# Patient Record
Sex: Male | Born: 1935 | Race: White | Hispanic: No | Smoking: Former smoker
Health system: Southern US, Community
[De-identification: ages and names within clinical notes are randomized; demographics above are authoritative.]

## PROBLEM LIST (undated history)

## (undated) DIAGNOSIS — H353110 Nonexudative age-related macular degeneration, right eye, stage unspecified: Secondary | ICD-10-CM

## (undated) DIAGNOSIS — I493 Ventricular premature depolarization: Secondary | ICD-10-CM

## (undated) DIAGNOSIS — K449 Diaphragmatic hernia without obstruction or gangrene: Secondary | ICD-10-CM

## (undated) DIAGNOSIS — J449 Chronic obstructive pulmonary disease, unspecified: Secondary | ICD-10-CM

## (undated) DIAGNOSIS — R0609 Other forms of dyspnea: Secondary | ICD-10-CM

## (undated) DIAGNOSIS — H3530 Unspecified macular degeneration: Secondary | ICD-10-CM

## (undated) DIAGNOSIS — K5190 Ulcerative colitis, unspecified, without complications: Secondary | ICD-10-CM

## (undated) DIAGNOSIS — H353220 Exudative age-related macular degeneration, left eye, stage unspecified: Secondary | ICD-10-CM

## (undated) DIAGNOSIS — H9190 Unspecified hearing loss, unspecified ear: Secondary | ICD-10-CM

## (undated) DIAGNOSIS — J302 Other seasonal allergic rhinitis: Secondary | ICD-10-CM

## (undated) DIAGNOSIS — R351 Nocturia: Secondary | ICD-10-CM

## (undated) DIAGNOSIS — H5462 Unqualified visual loss, left eye, normal vision right eye: Secondary | ICD-10-CM

## (undated) DIAGNOSIS — G4730 Sleep apnea, unspecified: Secondary | ICD-10-CM

## (undated) DIAGNOSIS — K219 Gastro-esophageal reflux disease without esophagitis: Secondary | ICD-10-CM

## (undated) DIAGNOSIS — T7840XA Allergy, unspecified, initial encounter: Secondary | ICD-10-CM

## (undated) DIAGNOSIS — E785 Hyperlipidemia, unspecified: Secondary | ICD-10-CM

## (undated) DIAGNOSIS — R7303 Prediabetes: Secondary | ICD-10-CM

## (undated) HISTORY — PX: CHOLECYSTECTOMY: SHX55

## (undated) HISTORY — DX: Allergy, unspecified, initial encounter: T78.40XA

## (undated) HISTORY — DX: Sleep apnea, unspecified: G47.30

## (undated) HISTORY — PX: TONSILLECTOMY: SUR1361

## (undated) HISTORY — PX: APPENDECTOMY: SHX54

---
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
## 1983-04-23 HISTORY — PX: CHOLECYSTECTOMY OPEN: SUR202

## 1998-06-08 ENCOUNTER — Ambulatory Visit (HOSPITAL_COMMUNITY): Admission: RE | Admit: 1998-06-08 | Discharge: 1998-06-08 | Payer: Self-pay | Admitting: Gastroenterology

## 2004-03-06 ENCOUNTER — Ambulatory Visit: Payer: Self-pay | Admitting: Internal Medicine

## 2005-07-02 ENCOUNTER — Ambulatory Visit: Payer: Self-pay | Admitting: Internal Medicine

## 2005-08-19 ENCOUNTER — Ambulatory Visit: Payer: Self-pay | Admitting: Internal Medicine

## 2005-08-22 ENCOUNTER — Ambulatory Visit: Payer: Self-pay

## 2005-09-30 ENCOUNTER — Ambulatory Visit: Payer: Self-pay | Admitting: Internal Medicine

## 2005-10-07 ENCOUNTER — Ambulatory Visit: Payer: Self-pay | Admitting: Internal Medicine

## 2006-03-11 ENCOUNTER — Ambulatory Visit: Payer: Self-pay | Admitting: Internal Medicine

## 2006-12-16 DIAGNOSIS — E785 Hyperlipidemia, unspecified: Secondary | ICD-10-CM | POA: Insufficient documentation

## 2007-02-02 ENCOUNTER — Ambulatory Visit: Payer: Self-pay | Admitting: Internal Medicine

## 2007-02-02 DIAGNOSIS — S61209A Unspecified open wound of unspecified finger without damage to nail, initial encounter: Secondary | ICD-10-CM | POA: Insufficient documentation

## 2007-07-15 ENCOUNTER — Ambulatory Visit: Payer: Self-pay | Admitting: Internal Medicine

## 2007-07-15 DIAGNOSIS — J069 Acute upper respiratory infection, unspecified: Secondary | ICD-10-CM | POA: Insufficient documentation

## 2008-04-22 DIAGNOSIS — Z8719 Personal history of other diseases of the digestive system: Secondary | ICD-10-CM

## 2008-04-22 HISTORY — DX: Personal history of other diseases of the digestive system: Z87.19

## 2013-04-22 HISTORY — PX: COLONOSCOPY: SHX5424

## 2015-04-23 DIAGNOSIS — G4733 Obstructive sleep apnea (adult) (pediatric): Secondary | ICD-10-CM

## 2015-04-23 HISTORY — DX: Obstructive sleep apnea (adult) (pediatric): G47.33

## 2017-04-22 HISTORY — PX: CATARACT EXTRACTION W/ INTRAOCULAR LENS IMPLANT: SHX1309

## 2020-04-22 DIAGNOSIS — Z8781 Personal history of (healed) traumatic fracture: Secondary | ICD-10-CM

## 2020-04-22 HISTORY — DX: Personal history of (healed) traumatic fracture: Z87.81

## 2020-10-09 DIAGNOSIS — H353124 Nonexudative age-related macular degeneration, left eye, advanced atrophic with subfoveal involvement: Secondary | ICD-10-CM | POA: Diagnosis not present

## 2020-10-09 DIAGNOSIS — H353211 Exudative age-related macular degeneration, right eye, with active choroidal neovascularization: Secondary | ICD-10-CM | POA: Diagnosis not present

## 2020-11-01 DIAGNOSIS — J309 Allergic rhinitis, unspecified: Secondary | ICD-10-CM | POA: Diagnosis not present

## 2020-11-01 DIAGNOSIS — J40 Bronchitis, not specified as acute or chronic: Secondary | ICD-10-CM | POA: Diagnosis not present

## 2020-11-01 DIAGNOSIS — G4733 Obstructive sleep apnea (adult) (pediatric): Secondary | ICD-10-CM | POA: Diagnosis not present

## 2020-11-10 DIAGNOSIS — S3991XA Unspecified injury of abdomen, initial encounter: Secondary | ICD-10-CM | POA: Diagnosis not present

## 2020-11-10 DIAGNOSIS — Z7951 Long term (current) use of inhaled steroids: Secondary | ICD-10-CM | POA: Diagnosis not present

## 2020-11-10 DIAGNOSIS — I2510 Atherosclerotic heart disease of native coronary artery without angina pectoris: Secondary | ICD-10-CM | POA: Diagnosis not present

## 2020-11-10 DIAGNOSIS — Z87891 Personal history of nicotine dependence: Secondary | ICD-10-CM | POA: Diagnosis not present

## 2020-11-10 DIAGNOSIS — K760 Fatty (change of) liver, not elsewhere classified: Secondary | ICD-10-CM | POA: Diagnosis not present

## 2020-11-10 DIAGNOSIS — J449 Chronic obstructive pulmonary disease, unspecified: Secondary | ICD-10-CM | POA: Diagnosis not present

## 2020-11-10 DIAGNOSIS — S299XXA Unspecified injury of thorax, initial encounter: Secondary | ICD-10-CM | POA: Diagnosis not present

## 2020-11-10 DIAGNOSIS — R0789 Other chest pain: Secondary | ICD-10-CM | POA: Diagnosis not present

## 2020-11-10 DIAGNOSIS — Z9049 Acquired absence of other specified parts of digestive tract: Secondary | ICD-10-CM | POA: Diagnosis not present

## 2020-11-10 DIAGNOSIS — S301XXA Contusion of abdominal wall, initial encounter: Secondary | ICD-10-CM | POA: Diagnosis not present

## 2020-11-10 DIAGNOSIS — Z888 Allergy status to other drugs, medicaments and biological substances status: Secondary | ICD-10-CM | POA: Diagnosis not present

## 2020-11-10 DIAGNOSIS — S20211A Contusion of right front wall of thorax, initial encounter: Secondary | ICD-10-CM | POA: Diagnosis not present

## 2020-11-10 DIAGNOSIS — Z882 Allergy status to sulfonamides status: Secondary | ICD-10-CM | POA: Diagnosis not present

## 2020-11-10 DIAGNOSIS — R0781 Pleurodynia: Secondary | ICD-10-CM | POA: Diagnosis not present

## 2020-11-10 DIAGNOSIS — Z79899 Other long term (current) drug therapy: Secondary | ICD-10-CM | POA: Diagnosis not present

## 2020-11-10 DIAGNOSIS — Z7952 Long term (current) use of systemic steroids: Secondary | ICD-10-CM | POA: Diagnosis not present

## 2020-11-10 DIAGNOSIS — S3993XA Unspecified injury of pelvis, initial encounter: Secondary | ICD-10-CM | POA: Diagnosis not present

## 2020-11-10 DIAGNOSIS — W050XXA Fall from non-moving wheelchair, initial encounter: Secondary | ICD-10-CM | POA: Diagnosis not present

## 2020-11-27 DIAGNOSIS — C44321 Squamous cell carcinoma of skin of nose: Secondary | ICD-10-CM | POA: Diagnosis not present

## 2020-11-27 DIAGNOSIS — L570 Actinic keratosis: Secondary | ICD-10-CM | POA: Diagnosis not present

## 2020-11-27 DIAGNOSIS — L578 Other skin changes due to chronic exposure to nonionizing radiation: Secondary | ICD-10-CM | POA: Diagnosis not present

## 2020-11-28 DIAGNOSIS — G4733 Obstructive sleep apnea (adult) (pediatric): Secondary | ICD-10-CM | POA: Diagnosis not present

## 2020-12-04 DIAGNOSIS — Z48817 Encounter for surgical aftercare following surgery on the skin and subcutaneous tissue: Secondary | ICD-10-CM | POA: Diagnosis not present

## 2020-12-06 DIAGNOSIS — K219 Gastro-esophageal reflux disease without esophagitis: Secondary | ICD-10-CM | POA: Diagnosis not present

## 2020-12-06 DIAGNOSIS — F4322 Adjustment disorder with anxiety: Secondary | ICD-10-CM | POA: Diagnosis not present

## 2020-12-06 DIAGNOSIS — H6122 Impacted cerumen, left ear: Secondary | ICD-10-CM | POA: Diagnosis not present

## 2020-12-06 DIAGNOSIS — J310 Chronic rhinitis: Secondary | ICD-10-CM | POA: Diagnosis not present

## 2020-12-06 DIAGNOSIS — R0982 Postnasal drip: Secondary | ICD-10-CM | POA: Diagnosis not present

## 2020-12-06 DIAGNOSIS — J42 Unspecified chronic bronchitis: Secondary | ICD-10-CM | POA: Diagnosis not present

## 2020-12-06 DIAGNOSIS — G4733 Obstructive sleep apnea (adult) (pediatric): Secondary | ICD-10-CM | POA: Diagnosis not present

## 2020-12-06 DIAGNOSIS — E669 Obesity, unspecified: Secondary | ICD-10-CM | POA: Diagnosis not present

## 2020-12-11 DIAGNOSIS — H353211 Exudative age-related macular degeneration, right eye, with active choroidal neovascularization: Secondary | ICD-10-CM | POA: Diagnosis not present

## 2020-12-11 DIAGNOSIS — H353124 Nonexudative age-related macular degeneration, left eye, advanced atrophic with subfoveal involvement: Secondary | ICD-10-CM | POA: Diagnosis not present

## 2020-12-12 DIAGNOSIS — J441 Chronic obstructive pulmonary disease with (acute) exacerbation: Secondary | ICD-10-CM | POA: Diagnosis not present

## 2020-12-12 DIAGNOSIS — Z9989 Dependence on other enabling machines and devices: Secondary | ICD-10-CM | POA: Diagnosis not present

## 2020-12-12 DIAGNOSIS — Z809 Family history of malignant neoplasm, unspecified: Secondary | ICD-10-CM | POA: Diagnosis not present

## 2020-12-12 DIAGNOSIS — X58XXXS Exposure to other specified factors, sequela: Secondary | ICD-10-CM | POA: Diagnosis not present

## 2020-12-12 DIAGNOSIS — R0600 Dyspnea, unspecified: Secondary | ICD-10-CM | POA: Diagnosis not present

## 2020-12-12 DIAGNOSIS — Z9049 Acquired absence of other specified parts of digestive tract: Secondary | ICD-10-CM | POA: Diagnosis not present

## 2020-12-12 DIAGNOSIS — J440 Chronic obstructive pulmonary disease with acute lower respiratory infection: Secondary | ICD-10-CM | POA: Diagnosis not present

## 2020-12-12 DIAGNOSIS — K219 Gastro-esophageal reflux disease without esophagitis: Secondary | ICD-10-CM | POA: Diagnosis not present

## 2020-12-12 DIAGNOSIS — Z66 Do not resuscitate: Secondary | ICD-10-CM | POA: Diagnosis not present

## 2020-12-12 DIAGNOSIS — J209 Acute bronchitis, unspecified: Secondary | ICD-10-CM | POA: Diagnosis not present

## 2020-12-12 DIAGNOSIS — S42301A Unspecified fracture of shaft of humerus, right arm, initial encounter for closed fracture: Secondary | ICD-10-CM | POA: Diagnosis not present

## 2020-12-12 DIAGNOSIS — S42301S Unspecified fracture of shaft of humerus, right arm, sequela: Secondary | ICD-10-CM | POA: Diagnosis not present

## 2020-12-12 DIAGNOSIS — Z7982 Long term (current) use of aspirin: Secondary | ICD-10-CM | POA: Diagnosis not present

## 2020-12-12 DIAGNOSIS — Z20822 Contact with and (suspected) exposure to covid-19: Secondary | ICD-10-CM | POA: Diagnosis not present

## 2020-12-12 DIAGNOSIS — D721 Eosinophilia, unspecified: Secondary | ICD-10-CM | POA: Diagnosis not present

## 2020-12-12 DIAGNOSIS — R0602 Shortness of breath: Secondary | ICD-10-CM | POA: Diagnosis not present

## 2020-12-12 DIAGNOSIS — Z888 Allergy status to other drugs, medicaments and biological substances status: Secondary | ICD-10-CM | POA: Diagnosis not present

## 2020-12-12 DIAGNOSIS — Z87891 Personal history of nicotine dependence: Secondary | ICD-10-CM | POA: Diagnosis not present

## 2020-12-12 DIAGNOSIS — R0902 Hypoxemia: Secondary | ICD-10-CM | POA: Diagnosis not present

## 2020-12-12 DIAGNOSIS — Z7951 Long term (current) use of inhaled steroids: Secondary | ICD-10-CM | POA: Diagnosis not present

## 2020-12-12 DIAGNOSIS — Z882 Allergy status to sulfonamides status: Secondary | ICD-10-CM | POA: Diagnosis not present

## 2020-12-12 DIAGNOSIS — Z79899 Other long term (current) drug therapy: Secondary | ICD-10-CM | POA: Diagnosis not present

## 2020-12-12 DIAGNOSIS — G4733 Obstructive sleep apnea (adult) (pediatric): Secondary | ICD-10-CM | POA: Diagnosis not present

## 2020-12-29 DIAGNOSIS — F5102 Adjustment insomnia: Secondary | ICD-10-CM | POA: Diagnosis not present

## 2020-12-29 DIAGNOSIS — G4733 Obstructive sleep apnea (adult) (pediatric): Secondary | ICD-10-CM | POA: Diagnosis not present

## 2020-12-29 DIAGNOSIS — J42 Unspecified chronic bronchitis: Secondary | ICD-10-CM | POA: Diagnosis not present

## 2020-12-29 DIAGNOSIS — R0982 Postnasal drip: Secondary | ICD-10-CM | POA: Diagnosis not present

## 2021-01-01 DIAGNOSIS — Z48817 Encounter for surgical aftercare following surgery on the skin and subcutaneous tissue: Secondary | ICD-10-CM | POA: Diagnosis not present

## 2021-01-08 DIAGNOSIS — Z0000 Encounter for general adult medical examination without abnormal findings: Secondary | ICD-10-CM | POA: Diagnosis not present

## 2021-01-14 DIAGNOSIS — J440 Chronic obstructive pulmonary disease with acute lower respiratory infection: Secondary | ICD-10-CM | POA: Diagnosis not present

## 2021-01-14 DIAGNOSIS — J209 Acute bronchitis, unspecified: Secondary | ICD-10-CM | POA: Diagnosis not present

## 2021-01-14 DIAGNOSIS — J441 Chronic obstructive pulmonary disease with (acute) exacerbation: Secondary | ICD-10-CM | POA: Diagnosis not present

## 2021-01-14 DIAGNOSIS — R058 Other specified cough: Secondary | ICD-10-CM | POA: Diagnosis not present

## 2021-02-13 DIAGNOSIS — R058 Other specified cough: Secondary | ICD-10-CM | POA: Diagnosis not present

## 2021-02-13 DIAGNOSIS — J441 Chronic obstructive pulmonary disease with (acute) exacerbation: Secondary | ICD-10-CM | POA: Diagnosis not present

## 2021-02-13 DIAGNOSIS — J440 Chronic obstructive pulmonary disease with acute lower respiratory infection: Secondary | ICD-10-CM | POA: Diagnosis not present

## 2021-02-13 DIAGNOSIS — J209 Acute bronchitis, unspecified: Secondary | ICD-10-CM | POA: Diagnosis not present

## 2021-02-14 DIAGNOSIS — Z743 Need for continuous supervision: Secondary | ICD-10-CM | POA: Diagnosis not present

## 2021-02-14 DIAGNOSIS — R069 Unspecified abnormalities of breathing: Secondary | ICD-10-CM | POA: Diagnosis not present

## 2021-02-14 DIAGNOSIS — R0602 Shortness of breath: Secondary | ICD-10-CM | POA: Diagnosis not present

## 2021-02-14 DIAGNOSIS — I10 Essential (primary) hypertension: Secondary | ICD-10-CM | POA: Diagnosis not present

## 2021-02-14 DIAGNOSIS — R0689 Other abnormalities of breathing: Secondary | ICD-10-CM | POA: Diagnosis not present

## 2021-02-14 DIAGNOSIS — R062 Wheezing: Secondary | ICD-10-CM | POA: Diagnosis not present

## 2021-02-16 DIAGNOSIS — J441 Chronic obstructive pulmonary disease with (acute) exacerbation: Secondary | ICD-10-CM | POA: Diagnosis not present

## 2021-02-16 DIAGNOSIS — J9601 Acute respiratory failure with hypoxia: Secondary | ICD-10-CM | POA: Diagnosis not present

## 2021-02-20 DIAGNOSIS — M9901 Segmental and somatic dysfunction of cervical region: Secondary | ICD-10-CM | POA: Diagnosis not present

## 2021-02-20 DIAGNOSIS — M9903 Segmental and somatic dysfunction of lumbar region: Secondary | ICD-10-CM | POA: Diagnosis not present

## 2021-02-20 DIAGNOSIS — M9902 Segmental and somatic dysfunction of thoracic region: Secondary | ICD-10-CM | POA: Diagnosis not present

## 2021-02-20 DIAGNOSIS — M9904 Segmental and somatic dysfunction of sacral region: Secondary | ICD-10-CM | POA: Diagnosis not present

## 2021-02-20 DIAGNOSIS — M9905 Segmental and somatic dysfunction of pelvic region: Secondary | ICD-10-CM | POA: Diagnosis not present

## 2021-02-22 DIAGNOSIS — M9905 Segmental and somatic dysfunction of pelvic region: Secondary | ICD-10-CM | POA: Diagnosis not present

## 2021-02-22 DIAGNOSIS — M9903 Segmental and somatic dysfunction of lumbar region: Secondary | ICD-10-CM | POA: Diagnosis not present

## 2021-02-22 DIAGNOSIS — M9901 Segmental and somatic dysfunction of cervical region: Secondary | ICD-10-CM | POA: Diagnosis not present

## 2021-02-22 DIAGNOSIS — M9904 Segmental and somatic dysfunction of sacral region: Secondary | ICD-10-CM | POA: Diagnosis not present

## 2021-02-22 DIAGNOSIS — M9902 Segmental and somatic dysfunction of thoracic region: Secondary | ICD-10-CM | POA: Diagnosis not present

## 2021-02-26 DIAGNOSIS — R0602 Shortness of breath: Secondary | ICD-10-CM | POA: Diagnosis not present

## 2021-02-26 DIAGNOSIS — M546 Pain in thoracic spine: Secondary | ICD-10-CM | POA: Diagnosis not present

## 2021-02-26 DIAGNOSIS — S39012A Strain of muscle, fascia and tendon of lower back, initial encounter: Secondary | ICD-10-CM | POA: Diagnosis not present

## 2021-02-26 DIAGNOSIS — I10 Essential (primary) hypertension: Secondary | ICD-10-CM | POA: Diagnosis not present

## 2021-02-26 DIAGNOSIS — R110 Nausea: Secondary | ICD-10-CM | POA: Diagnosis not present

## 2021-02-26 DIAGNOSIS — Z87891 Personal history of nicotine dependence: Secondary | ICD-10-CM | POA: Diagnosis not present

## 2021-02-26 DIAGNOSIS — S22060A Wedge compression fracture of T7-T8 vertebra, initial encounter for closed fracture: Secondary | ICD-10-CM | POA: Diagnosis not present

## 2021-02-26 DIAGNOSIS — M4316 Spondylolisthesis, lumbar region: Secondary | ICD-10-CM | POA: Diagnosis not present

## 2021-02-26 DIAGNOSIS — J449 Chronic obstructive pulmonary disease, unspecified: Secondary | ICD-10-CM | POA: Diagnosis not present

## 2021-02-26 DIAGNOSIS — M47816 Spondylosis without myelopathy or radiculopathy, lumbar region: Secondary | ICD-10-CM | POA: Diagnosis not present

## 2021-02-26 DIAGNOSIS — M545 Low back pain, unspecified: Secondary | ICD-10-CM | POA: Diagnosis not present

## 2021-02-26 DIAGNOSIS — R059 Cough, unspecified: Secondary | ICD-10-CM | POA: Diagnosis not present

## 2021-02-26 DIAGNOSIS — Z743 Need for continuous supervision: Secondary | ICD-10-CM | POA: Diagnosis not present

## 2021-02-26 DIAGNOSIS — S22069A Unspecified fracture of T7-T8 vertebra, initial encounter for closed fracture: Secondary | ICD-10-CM | POA: Diagnosis not present

## 2021-02-26 DIAGNOSIS — M5136 Other intervertebral disc degeneration, lumbar region: Secondary | ICD-10-CM | POA: Diagnosis not present

## 2021-02-27 DIAGNOSIS — Z9989 Dependence on other enabling machines and devices: Secondary | ICD-10-CM | POA: Diagnosis not present

## 2021-02-27 DIAGNOSIS — M549 Dorsalgia, unspecified: Secondary | ICD-10-CM | POA: Diagnosis not present

## 2021-02-27 DIAGNOSIS — J441 Chronic obstructive pulmonary disease with (acute) exacerbation: Secondary | ICD-10-CM | POA: Diagnosis not present

## 2021-02-27 DIAGNOSIS — S22060A Wedge compression fracture of T7-T8 vertebra, initial encounter for closed fracture: Secondary | ICD-10-CM | POA: Diagnosis not present

## 2021-02-27 DIAGNOSIS — J449 Chronic obstructive pulmonary disease, unspecified: Secondary | ICD-10-CM | POA: Diagnosis not present

## 2021-02-27 DIAGNOSIS — G4730 Sleep apnea, unspecified: Secondary | ICD-10-CM | POA: Diagnosis not present

## 2021-03-13 ENCOUNTER — Other Ambulatory Visit: Payer: Self-pay

## 2021-03-13 ENCOUNTER — Emergency Department (HOSPITAL_COMMUNITY): Payer: Medicare HMO

## 2021-03-13 ENCOUNTER — Emergency Department (HOSPITAL_COMMUNITY)
Admission: EM | Admit: 2021-03-13 | Discharge: 2021-03-13 | Disposition: A | Discharge status: Home / Self Care | Payer: Medicare HMO | Attending: Emergency Medicine | Admitting: Emergency Medicine

## 2021-03-13 ENCOUNTER — Encounter (HOSPITAL_COMMUNITY): Payer: Self-pay

## 2021-03-13 DIAGNOSIS — Z7951 Long term (current) use of inhaled steroids: Secondary | ICD-10-CM | POA: Insufficient documentation

## 2021-03-13 DIAGNOSIS — Z20822 Contact with and (suspected) exposure to covid-19: Secondary | ICD-10-CM | POA: Insufficient documentation

## 2021-03-13 DIAGNOSIS — Z87891 Personal history of nicotine dependence: Secondary | ICD-10-CM | POA: Insufficient documentation

## 2021-03-13 DIAGNOSIS — J441 Chronic obstructive pulmonary disease with (acute) exacerbation: Secondary | ICD-10-CM | POA: Insufficient documentation

## 2021-03-13 DIAGNOSIS — R0602 Shortness of breath: Secondary | ICD-10-CM | POA: Diagnosis not present

## 2021-03-13 HISTORY — DX: Chronic obstructive pulmonary disease, unspecified: J44.9

## 2021-03-13 LAB — BASIC METABOLIC PANEL
Anion gap: 7 (ref 5–15)
BUN: 14 mg/dL (ref 8–23)
CO2: 23 mmol/L (ref 22–32)
Calcium: 9.3 mg/dL (ref 8.9–10.3)
Chloride: 105 mmol/L (ref 98–111)
Creatinine, Ser: 0.99 mg/dL (ref 0.61–1.24)
GFR, Estimated: >60 mL/min (ref >60)
Glucose, Bld: 115 mg/dL — ABNORMAL HIGH (ref 70–99)
Potassium: 4.1 mmol/L (ref 3.5–5.1)
Sodium: 135 mmol/L (ref 135–145)

## 2021-03-13 LAB — CBC WITH DIFFERENTIAL/PLATELET
Abs Immature Granulocytes: 0.03 10*3/uL (ref 0.00–0.07)
Basophils Absolute: 0.1 10*3/uL (ref 0.0–0.1)
Basophils Relative: 1 %
Eosinophils Absolute: 0.4 10*3/uL (ref 0.0–0.5)
Eosinophils Relative: 5 %
HCT: 41.5 % (ref 39.0–52.0)
Hemoglobin: 14.1 g/dL (ref 13.0–17.0)
Immature Granulocytes: 0 %
Lymphocytes Relative: 24 %
Lymphs Abs: 1.8 10*3/uL (ref 0.7–4.0)
MCH: 30.8 pg (ref 26.0–34.0)
MCHC: 34 g/dL (ref 30.0–36.0)
MCV: 90.6 fL (ref 80.0–100.0)
Monocytes Absolute: 0.7 10*3/uL (ref 0.1–1.0)
Monocytes Relative: 9 %
Neutro Abs: 4.6 10*3/uL (ref 1.7–7.7)
Neutrophils Relative %: 61 %
Platelets: 276 10*3/uL (ref 150–400)
RBC: 4.58 MIL/uL (ref 4.22–5.81)
RDW: 14.4 % (ref 11.5–15.5)
WBC: 7.5 10*3/uL (ref 4.0–10.5)
nRBC: 0 % (ref 0.0–0.2)

## 2021-03-13 LAB — RESP PANEL BY RT-PCR (FLU A&B, COVID) ARPGX2
Influenza A by PCR: NEGATIVE
Influenza B by PCR: NEGATIVE
SARS Coronavirus 2 by RT PCR: NEGATIVE

## 2021-03-13 LAB — BRAIN NATRIURETIC PEPTIDE: B Natriuretic Peptide: 14.9 pg/mL (ref 0.0–100.0)

## 2021-03-13 MED ORDER — AEROCHAMBER Z-STAT PLUS/MEDIUM MISC
1.0000 | Freq: Once | Status: AC
  Administered 2021-03-13: 1
  Filled 2021-03-13: qty 1

## 2021-03-13 MED ORDER — MORPHINE SULFATE (PF) 4 MG/ML IV SOLN
4.0000 mg | Freq: Once | INTRAVENOUS | Status: AC
  Administered 2021-03-13: 4 mg via INTRAVENOUS
  Filled 2021-03-13: qty 1

## 2021-03-13 MED ORDER — PREDNISONE 10 MG (21) PO TBPK: ORAL_TABLET | Freq: Every day | ORAL | 0 refills | Status: DC

## 2021-03-13 MED ORDER — ALBUTEROL SULFATE (2.5 MG/3ML) 0.083% IN NEBU
INHALATION_SOLUTION | RESPIRATORY_TRACT | Status: AC
  Administered 2021-03-13: 10 mg
  Filled 2021-03-13: qty 12

## 2021-03-13 MED ORDER — DULERA 200-5 MCG/ACT IN AERO: 2.0000 | INHALATION_SPRAY | Freq: Two times a day (BID) | RESPIRATORY_TRACT | 0 refills | Status: DC

## 2021-03-13 MED ORDER — IPRATROPIUM-ALBUTEROL 0.5-2.5 (3) MG/3ML IN SOLN
3.0000 mL | Freq: Once | RESPIRATORY_TRACT | Status: AC
  Administered 2021-03-13: 3 mL via RESPIRATORY_TRACT
  Filled 2021-03-13: qty 3

## 2021-03-13 MED ORDER — METHYLPREDNISOLONE SODIUM SUCC 125 MG IJ SOLR
125.0000 mg | Freq: Once | INTRAMUSCULAR | Status: AC
  Administered 2021-03-13: 125 mg via INTRAVENOUS
  Filled 2021-03-13: qty 2

## 2021-03-13 MED ORDER — MOMETASONE FURO-FORMOTEROL FUM 200-5 MCG/ACT IN AERO
2.0000 | INHALATION_SPRAY | Freq: Two times a day (BID) | RESPIRATORY_TRACT | Status: DC
Start: 2021-03-13 — End: 2021-03-13
  Administered 2021-03-13: 2 via RESPIRATORY_TRACT
  Filled 2021-03-13: qty 8.8

## 2021-03-13 MED ORDER — ALBUTEROL (5 MG/ML) CONTINUOUS INHALATION SOLN: 10.0000 mg/h | INHALATION_SOLUTION | RESPIRATORY_TRACT | Status: DC

## 2021-03-13 MED ORDER — ALBUTEROL SULFATE HFA 108 (90 BASE) MCG/ACT IN AERS
1.0000 | INHALATION_SPRAY | RESPIRATORY_TRACT | Status: DC | PRN
Start: 2021-03-13 — End: 2021-03-13
  Administered 2021-03-13: 2 via RESPIRATORY_TRACT
  Filled 2021-03-13: qty 6.7

## 2021-03-13 NOTE — ED Triage Notes (Signed)
Pt c/o SOB starting last night. Pt hx of COPD. Pt states dyspnea on exertion and at rest. Pt states at home meds not helping. Pt c/o coughing starting yesterday.

## 2021-03-13 NOTE — ED Provider Notes (Signed)
[facility]-EMERGENCY DEPT Provider Note   CSN: [id number] Arrival date & time: 03/13/21  1116     History Chief Complaint  Patient presents with   Shortness of Breath    Brian Hopkins is a 87 y.o. male.  Pt presents to the ED today with sob.  Pt has a hx of COPD and is in the process of moving from [place], [place] to [place].  His wife passed away a month ago and he is moving in with his daughter.  Pt's daughter said he usually responds well to prednisone.  Pt's daughter said she called his doctor in [place] last week to get a rx for prednisone, but has not had a reply.  Pt has an albuterol inhaler (no spacer) and no inhaled steroid inhaler.       Past Medical History:  Diagnosis Date   COPD (chronic obstructive pulmonary disease) (HCC)     Patient Active Problem List   Diagnosis Date Noted   URI 07/15/2007   LACERATION, LEFT THUMB 02/02/2007   HYPERLIPIDEMIA 12/16/2006    Past Surgical History:  Procedure Laterality Date   APPENDECTOMY     CHOLECYSTECTOMY         History reviewed. No pertinent family history.  Social History   Tobacco Use   Smoking status: Former    Types: Cigarettes   Smokeless tobacco: Never  Substance Use Topics   Alcohol use: Not Currently   Drug use: Never    Home Medications Prior to Admission medications   Medication Sig Start Date End Date Taking? Authorizing Provider  acetaminophen (TYLENOL) 650 MG CR tablet Take 1,300 mg by mouth every 8 (eight) hours as needed for pain.   Yes [provider]  albuterol (VENTOLIN HFA) 108 (90 Base) MCG/ACT inhaler Inhale 2 puffs into the lungs every 4 (four) hours as needed for shortness of breath. 11/01/13  Yes [provider]  azelastine (ASTELIN) 0.1 % nasal spray Place 2 sprays into the nose 2 (two) times daily as needed for congestion.   Yes [provider]  benzonatate (TESSALON) 200 MG capsule Take 200 mg by mouth 3 (three) times daily as needed for  cough. 02/27/21  Yes [provider]  Calcium-Magnesium-Zinc 333-133-5 MG TABS Take 1 tablet by mouth daily.   Yes [provider]  Cholecalciferol 125 MCG (5000 UT) TABS Take 5,000 Units by mouth daily.   Yes [provider]  famotidine (PEPCID) 20 MG tablet Take 20 mg by mouth daily as needed for indigestion.   Yes [provider]  ipratropium-albuterol (DUONEB) 0.5-2.5 (3) MG/3ML SOLN Inhale 3 mLs into the lungs every 6 (six) hours as needed for shortness of breath or wheezing. 10/17/16  Yes [provider]  methocarbamol (ROBAXIN) 750 MG tablet Take 750 mg by mouth in the morning, at noon, and at bedtime. 02/27/21  Yes [provider]  mometasone-formoterol (DULERA) 200-5 MCG/ACT AERO Inhale 2 puffs into the lungs 2 (two) times daily. 03/13/21  Yes Jacalyn Lefevre, MD  polyethylene glycol powder (GLYCOLAX/MIRALAX) 17 GM/SCOOP powder Take 17 g by mouth daily as needed for constipation. 02/27/21  Yes [provider]  predniSONE (STERAPRED UNI-PAK 21 TAB) 10 MG (21) TBPK tablet Take by mouth daily. Take 6 tabs by mouth daily  for 2 days, then 5 tabs for 2 days, then 4 tabs for 2 days, then 3 tabs for 2 days, 2 tabs for 2 days, then 1 tab by mouth daily for 2 days 03/13/21  Yes Jacalyn Lefevre, MD  umeclidinium-vilanterol [facility] ELLIPTA) 62.5-25 MCG/ACT AEPB Inhale 1 puff into the lungs daily. 11/13/16  Yes [provider]    Allergies    Sulfonamide derivatives  Review of Systems   Review of Systems  Respiratory:  Positive for cough and shortness of breath.   All other systems reviewed and are negative.  Physical Exam Updated Vital Signs BP 128/76   Pulse 99   Temp 98 F (36.7 C) (Oral)   Resp 15   Ht 5\' 7"  (1.702 m)   Wt 95.3 kg   SpO2 91%   BMI 32.89 kg/m   Physical Exam Vitals and nursing note reviewed.  Constitutional:      Appearance: He is well-developed.  HENT:     Head: Normocephalic and atraumatic.      Mouth/Throat:     Mouth: Mucous membranes are moist.  Eyes:     Extraocular Movements: Extraocular movements intact.     Pupils: Pupils are equal, round, and reactive to light.  Cardiovascular:     Rate and Rhythm: Normal rate and regular rhythm.  Pulmonary:     Effort: Pulmonary effort is normal.     Breath sounds: Wheezing present.  Abdominal:     Palpations: Abdomen is soft.  Musculoskeletal:        General: Normal range of motion.     Cervical back: Normal range of motion and neck supple.  Skin:    General: Skin is warm.     Capillary Refill: Capillary refill takes less than 2 seconds.  Neurological:     General: No focal deficit present.     Mental Status: He is alert and oriented to person, place, and time.  Psychiatric:        Mood and Affect: Mood normal.        Behavior: Behavior normal.    ED Results / Procedures / Treatments   Labs (all labs ordered are listed, but only abnormal results are displayed) Labs Reviewed  BASIC METABOLIC PANEL - Abnormal; Notable for the following components:      Result Value   Glucose, Bld 115 (*)    All other components within normal limits  RESP PANEL BY RT-PCR (FLU A&B, COVID) ARPGX2  BRAIN NATRIURETIC PEPTIDE  CBC WITH DIFFERENTIAL/PLATELET    EKG EKG Interpretation  Date/Time:  Tuesday March 13 2021 11:34:05 EST Ventricular Rate:  87 PR Interval:  153 QRS Duration: 113 QT Interval:  370 QTC Calculation: 446 R Axis:   -20 Text Interpretation: Sinus rhythm Multiple premature complexes, vent & supraven Borderline intraventricular conduction delay Nonspecific T abnormalities, lateral leads No old tracing to compare Confirmed by [id number] [phone]) on 03/13/2021 12:35:55 PM  Radiology DG Chest Port 1 View  Result Date: 03/13/2021 CLINICAL DATA:  Shortness of breath, history of COPD. EXAM: PORTABLE CHEST 1 VIEW COMPARISON:  None. FINDINGS: The heart size and mediastinal contours are within normal limits. Both lungs  are clear without evidence of focal consolidation or pleural effusion. Hyperinflated lungs concerning for COPD. The visualized skeletal structures are unremarkable. IMPRESSION: No active disease. Electronically Signed   By: 03/15/2021 D.O.   On: 03/13/2021 11:53    Procedures Procedures   Medications Ordered in ED Medications  albuterol (PROVENTIL,VENTOLIN) solution continuous neb (10 mg/hr Nebulization Not Given 03/13/21 1323)  aerochamber Z-Stat Plus/medium 1 each (has no administration in time range)  albuterol (VENTOLIN HFA) 108 (90 Base) MCG/ACT inhaler 1-2 puff (has no administration in time range)  mometasone-formoterol (DULERA) 200-5 MCG/ACT inhaler 2 puff (has no administration in time range)  ipratropium-albuterol (DUONEB) 0.5-2.5 (3) MG/3ML nebulizer solution 3 mL (3 mLs Nebulization Given 03/13/21 1151)  methylPREDNISolone sodium succinate (SOLU-MEDROL) 125 mg/2 mL injection 125 mg (125 mg Intravenous Given 03/13/21 1157)  morphine 4 MG/ML injection 4 mg (4 mg Intravenous Given 03/13/21 1315)  albuterol (PROVENTIL) (2.5 MG/3ML) 0.083% nebulizer solution (10 mg  Given 03/13/21 1323)    ED Course  I have reviewed the triage vital signs and the nursing notes.  Pertinent labs & imaging results that were available during my care of the patient were reviewed by me and considered in my medical decision making (see chart for details).    MDM Rules/Calculators/A&P                           Pt is feeling much better after nebs.  He will be given Dulera and an Albuterol inhaler + spacer prior to d/c.  He has f/u with [facility] pulmonology soon.  He is instructed to return if worse.  Final Clinical Impression(s) / ED Diagnoses Final diagnoses:  COPD exacerbation (HCC)    Rx / DC Orders ED Discharge Orders          Ordered    predniSONE (STERAPRED UNI-PAK 21 TAB) 10 MG (21) TBPK tablet  Daily        03/13/21 1452    mometasone-formoterol (DULERA) 200-5 MCG/ACT AERO  2 times  daily        03/13/21 1452             Jacalyn Lefevre, MD 03/13/21 1556

## 2021-03-13 NOTE — ED Notes (Signed)
Pt O2 sat > 92% while ambulating. No c/o SOB. Noted stable gait t/o ambulation.

## 2021-03-16 DIAGNOSIS — J440 Chronic obstructive pulmonary disease with acute lower respiratory infection: Secondary | ICD-10-CM | POA: Diagnosis not present

## 2021-03-16 DIAGNOSIS — R058 Other specified cough: Secondary | ICD-10-CM | POA: Diagnosis not present

## 2021-03-16 DIAGNOSIS — J441 Chronic obstructive pulmonary disease with (acute) exacerbation: Secondary | ICD-10-CM | POA: Diagnosis not present

## 2021-03-16 DIAGNOSIS — J209 Acute bronchitis, unspecified: Secondary | ICD-10-CM | POA: Diagnosis not present

## 2021-03-29 ENCOUNTER — Other Ambulatory Visit: Payer: Self-pay

## 2021-03-29 ENCOUNTER — Telehealth: Payer: Self-pay | Admitting: Internal Medicine

## 2021-03-29 ENCOUNTER — Encounter: Payer: Self-pay | Admitting: Internal Medicine

## 2021-03-29 ENCOUNTER — Ambulatory Visit: Payer: Medicare HMO | Admitting: Internal Medicine

## 2021-03-29 VITALS — BP 120/64 | HR 74 | Temp 97.8°F | Ht 68.0 in | Wt 216.2 lb

## 2021-03-29 DIAGNOSIS — G4719 Other hypersomnia: Secondary | ICD-10-CM

## 2021-03-29 DIAGNOSIS — J449 Chronic obstructive pulmonary disease, unspecified: Secondary | ICD-10-CM | POA: Diagnosis not present

## 2021-03-29 MED ORDER — IPRATROPIUM-ALBUTEROL 0.5-2.5 (3) MG/3ML IN SOLN: 3.0000 mL | Freq: Four times a day (QID) | RESPIRATORY_TRACT | 12 refills | Status: DC | PRN

## 2021-03-29 MED ORDER — AZELASTINE HCL 0.1 % NA SOLN: 2.0000 | Freq: Two times a day (BID) | NASAL | 10 refills | Status: AC

## 2021-03-29 MED ORDER — FLUTICASONE PROPIONATE 50 MCG/ACT NA SUSP: 1.0000 | Freq: Every day | NASAL | 10 refills | Status: DC

## 2021-03-29 MED ORDER — ALBUTEROL SULFATE HFA 108 (90 BASE) MCG/ACT IN AERS: 2.0000 | INHALATION_SPRAY | RESPIRATORY_TRACT | 12 refills | Status: DC | PRN

## 2021-03-29 MED ORDER — AZITHROMYCIN 250 MG PO TABS: ORAL_TABLET | ORAL | 3 refills | Status: DC

## 2021-03-29 MED ORDER — ANORO ELLIPTA 62.5-25 MCG/ACT IN AEPB: 1.0000 | INHALATION_SPRAY | Freq: Every day | RESPIRATORY_TRACT | 10 refills | Status: DC

## 2021-03-29 MED ORDER — DULERA 200-5 MCG/ACT IN AERO: 2.0000 | INHALATION_SPRAY | Freq: Two times a day (BID) | RESPIRATORY_TRACT | 12 refills | Status: DC

## 2021-03-29 MED ORDER — PREDNISONE 20 MG PO TABS: 20.0000 mg | ORAL_TABLET | Freq: Every day | ORAL | 3 refills | Status: DC

## 2021-03-29 NOTE — Telephone Encounter (Signed)
Received verbal from patient to speak with daughter, Arline Asp.  Arline Asp is questioning if it okay for patient to get PNA shot since patient has had recent COPD flares.  Dr. Belia Heman, please advise.

## 2021-03-29 NOTE — Patient Instructions (Signed)
HOME SLEEP TEST NEEDED TO ASSESS SLEEP APNEA CONTINUE DULERA AND ANORO AS PRESCRIBED  REFILLS FOR ALL MEDS GIVEN

## 2021-03-29 NOTE — Progress Notes (Signed)
Name: Brian Hopkins MRN: [id number] DOB: 08/08/1931     CONSULTATION DATE: 03/29/2021   CHIEF COMPLAINT: Excessive daytime sleepiness Assessment for COPD HISTORY OF PRESENT ILLNESS:  Patient is seen today for problems and issues with sleep related to excessive daytime sleepiness Patient  has been having sleep problems for many years Patient has been having excessive daytime sleepiness for a long time Patient has been having extreme fatigue and tiredness, lack of energy +  very Loud snoring every night + struggling breathe at night and gasps for air Patient's subsequently was diagnosed with sleep apnea and 2017 Since then patient was using CPAP Nasal pillows CPAP 8 cm of water pressure Report show excellent compliance Over the last several weeks patient had COPD exacerbation was seen at [facility] ER on 03/13/2021 patient was given prednisone antibiotics and Dulera and ANORO  Exacerbation has resolved patient continues to take prednisone We will plan to have backup prednisone and Z-Pak available for patient Patient would like all refills of medications        Discussed sleep data and reviewed with patient.  Encouraged proper weight management.  Discussed driving precautions and its relationship with hypersomnolence.  Discussed operating dangerous equipment and its relationship with hypersomnolence.  Discussed sleep hygiene, and benefits of a fixed sleep waked time.  The importance of getting eight or more hours of sleep discussed with patient.  Discussed limiting the use of the computer and television before bedtime.  Decrease naps during the day, so night time sleep will become enhanced.  Limit caffeine, and sleep deprivation.  HTN, stroke, and heart failure are potential risk factors.    EPWORTH SLEEP SCORE 10  No exacerbation at this time No evidence of heart failure at this time No evidence or signs of infection at this time No respiratory distress No  fevers, chills, nausea, vomiting, diarrhea No evidence of lower extremity edema No evidence hemoptysis   Regarding OSA Patient would like to get retested to assess for sleep apnea He has not been using his CPAP for the last several weeks and has been feeling better  Regarding COPD Based on previous pulmonary function testing from office visit and carry patient has an FEV1 of 97% predicted which shows mild obstructive lung disease in 2018 Patient currently taking both Anoro and Dulera and that seems to help both of these drugs are once a day dosing that helps him   PAST MEDICAL HISTORY :   has a past medical history of COPD (chronic obstructive pulmonary disease) (HCC).  has a past surgical history that includes Cholecystectomy and Appendectomy. Prior to Admission medications   Medication Sig Start Date End Date Taking? Authorizing Provider  acetaminophen (TYLENOL) 650 MG CR tablet Take 1,300 mg by mouth every 8 (eight) hours as needed for pain.    [provider]  albuterol (VENTOLIN HFA) 108 (90 Base) MCG/ACT inhaler Inhale 2 puffs into the lungs every 4 (four) hours as needed for shortness of breath. 11/01/13   [provider]  azelastine (ASTELIN) 0.1 % nasal spray Place 2 sprays into the nose 2 (two) times daily as needed for congestion.    [provider]  benzonatate (TESSALON) 200 MG capsule Take 200 mg by mouth 3 (three) times daily as needed for cough. 02/27/21   [provider]  Calcium-Magnesium-Zinc [phone] MG TABS Take 1 tablet by mouth daily.    [provider]  Cholecalciferol 125 MCG (5000 UT) TABS Take 5,000 Units by mouth daily.  [provider]  famotidine (PEPCID) 20 MG tablet Take 20 mg by mouth daily as needed for indigestion.    [provider]  ipratropium-albuterol (DUONEB) 0.5-2.5 (3) MG/3ML SOLN Inhale 3 mLs into the lungs every 6 (six) hours as needed for shortness of breath or wheezing. 10/17/16    [provider]  methocarbamol (ROBAXIN) 750 MG tablet Take 750 mg by mouth in the morning, at noon, and at bedtime. 02/27/21   [provider]  mometasone-formoterol (DULERA) 200-5 MCG/ACT AERO Inhale 2 puffs into the lungs 2 (two) times daily. 03/13/21   Jacalyn Lefevre, MD  polyethylene glycol powder (GLYCOLAX/MIRALAX) 17 GM/SCOOP powder Take 17 g by mouth daily as needed for constipation. 02/27/21   [provider]  predniSONE (STERAPRED UNI-PAK 21 TAB) 10 MG (21) TBPK tablet Take by mouth daily. Take 6 tabs by mouth daily  for 2 days, then 5 tabs for 2 days, then 4 tabs for 2 days, then 3 tabs for 2 days, 2 tabs for 2 days, then 1 tab by mouth daily for 2 days 03/13/21   Jacalyn Lefevre, MD  umeclidinium-vilanterol [facility] ELLIPTA) 62.5-25 MCG/ACT AEPB Inhale 1 puff into the lungs daily. 11/13/16   [provider]   Allergies  Allergen Reactions   Sulfonamide Derivatives Other (See Comments)    UNK reaction from childhood    FAMILY HISTORY:  family history is not on file. SOCIAL HISTORY:  reports that he has quit smoking. His smoking use included cigarettes. He has never used smokeless tobacco. He reports that he does not currently use alcohol. He reports that he does not use drugs.   Review of Systems:  Gen:  Denies  fever, sweats, chills weight loss  HEENT: Denies blurred vision, double vision, ear pain, eye pain, hearing loss, nose bleeds, sore throat Cardiac:  No dizziness, chest pain or heaviness, chest tightness,edema, No JVD Resp:   No cough, -sputum production, -shortness of breath,-wheezing, -hemoptysis,  Gi: Denies swallowing difficulty, stomach pain, nausea or vomiting, diarrhea, constipation, bowel incontinence Gu:  Denies bladder incontinence, burning urine Ext:   Denies Joint pain, stiffness or swelling Skin: Denies  skin rash, easy bruising or bleeding or hives Endoc:  Denies polyuria, polydipsia , polyphagia or weight change Psych:    Denies depression, insomnia or hallucinations  Other:  All other systems negative   ALL OTHER ROS ARE NEGATIVE   BP 120/64 (BP Location: Left Arm, Patient Position: Sitting, Cuff Size: Normal)   Pulse 74   Temp 97.8 F (36.6 C) (Oral)   Ht 5\' 8"  (1.727 m)   Wt 216 lb 3.2 oz (98.1 kg)   SpO2 96%   BMI 32.87 kg/m     Physical Examination:   General Appearance: No distress  EYES PERRLA, EOM intact.   NECK Supple, No JVD Pulmonary: normal breath sounds, No wheezing.  CardiovascularNormal S1,S2.  No m/r/g.   Abdomen: Benign, Soft, non-tender. Skin:   warm, no rashes, no ecchymosis  Extremities: normal, no cyanosis, clubbing. Neuro:without focal findings,  speech normal  PSYCHIATRIC: Mood, affect within normal limits.   ALL OTHER ROS ARE NEGATIVE    ASSESSMENT AND PLAN SYNOPSIS  86 year old pleasant white male with excessive daytime sleepiness here for re-assessment for sleep apnea and to establish care and assess his COPD  Regarding COPD 2018 pulmonary function test shows that he has mild obstructive lung disease Recent COPD exacerbation puts him in a category of stage C Has been started on LAMA and LABA and inhaled  corticosteroids combination with Dulera and Anoro and he likes this combination and would like to continue with this regimen as both of these drugs he is using his once daily dosing No exacerbation at this time No indication for prednisone No indication for bronchoscopy No indication for antibiotics Refills of all of his meds were prescribed Patient has been prescribed backup prednisone and antibiotics as requested  Regarding sleep apnea Previous report showed excellent compliance Patient would like retesting of sleep apnea Home sleep test ordered   MEDICATION ADJUSTMENTS/LABS AND TESTS ORDERED: HOME SLEEP TEST NEEDED TO ASSESS SLEEP APNEA CONTINUE DULERA AND ANORO AS PRESCRIBED  REFILLS FOR ALL MEDS GIVEN CURRENT MEDICATIONS REVIEWED AT LENGTH  WITH PATIENT TODAY   Patient  satisfied with Plan of action and management. All questions answered  Follow up  6 months  Total Time Spent  45 mins   Wallis Bamberg Santiago Glad, M.D.  [facility]  Medical Director [facility] Medical Director [facility] Cardio-Pulmonary Department

## 2021-03-30 ENCOUNTER — Telehealth: Payer: Self-pay | Admitting: Pulmonary Disease

## 2021-03-30 NOTE — Telephone Encounter (Signed)
error 

## 2021-04-02 DIAGNOSIS — H353211 Exudative age-related macular degeneration, right eye, with active choroidal neovascularization: Secondary | ICD-10-CM | POA: Diagnosis not present

## 2021-04-02 DIAGNOSIS — H353124 Nonexudative age-related macular degeneration, left eye, advanced atrophic with subfoveal involvement: Secondary | ICD-10-CM | POA: Diagnosis not present

## 2021-04-02 NOTE — Telephone Encounter (Signed)
Patient's daughter, Arline Asp is aware of below recommendations and voiced her understanding.  Nothing further needed at this time.

## 2021-04-02 NOTE — Telephone Encounter (Signed)
Dr. Kasa, please advise. Thanks °

## 2021-04-03 ENCOUNTER — Other Ambulatory Visit: Payer: Self-pay | Admitting: Internal Medicine

## 2021-04-03 DIAGNOSIS — J449 Chronic obstructive pulmonary disease, unspecified: Secondary | ICD-10-CM

## 2021-04-08 ENCOUNTER — Encounter: Payer: Self-pay | Admitting: Internal Medicine

## 2021-04-09 ENCOUNTER — Other Ambulatory Visit (HOSPITAL_COMMUNITY): Payer: Self-pay

## 2021-04-09 ENCOUNTER — Telehealth: Payer: Self-pay | Admitting: Internal Medicine

## 2021-04-09 NOTE — Telephone Encounter (Signed)
Received verbal from patient to speak to with daughter, Cindy(DPR). Arline Asp stated that patient is taking both Anoro and Dulera. He currently has anoro on hold at the pharmacy because he has to full boxes at home. Arline Asp would like for patient to continue with dulera, as it is effective for patient.  PA team, please advise. thanks

## 2021-04-09 NOTE — Telephone Encounter (Signed)
The requested drug is not covered by the plan. In order for it to be covered, you are required to have tried and failed all alternatives or confirm that you believe the alternatives would have serious adverse side effects and/or be ineffective for the patient.   PA form proceeds to list each alternative and ask if pt has been tried and failed, or is likely to cause adverse reaction or be ineffective.  Advair Diskus or HFA, Markus Daft, Breo, Symbicort, and Trelegy.  I do not find that the pt has had any of these. Can you attest that all these would cause adverse reaction or be ineffective?

## 2021-04-09 NOTE — Telephone Encounter (Signed)
Per patient, Elwin Sleight requires a PA.  Pharmacy team, can you guys assist with this? Thanks

## 2021-04-11 NOTE — Telephone Encounter (Signed)
Dr. Kasa, please advise. Thanks °

## 2021-04-15 DIAGNOSIS — J209 Acute bronchitis, unspecified: Secondary | ICD-10-CM | POA: Diagnosis not present

## 2021-04-15 DIAGNOSIS — R058 Other specified cough: Secondary | ICD-10-CM | POA: Diagnosis not present

## 2021-04-15 DIAGNOSIS — J441 Chronic obstructive pulmonary disease with (acute) exacerbation: Secondary | ICD-10-CM | POA: Diagnosis not present

## 2021-04-15 DIAGNOSIS — J440 Chronic obstructive pulmonary disease with acute lower respiratory infection: Secondary | ICD-10-CM | POA: Diagnosis not present

## 2021-04-17 ENCOUNTER — Encounter: Payer: Self-pay | Admitting: Internal Medicine

## 2021-04-18 ENCOUNTER — Other Ambulatory Visit (HOSPITAL_COMMUNITY): Payer: Self-pay

## 2021-04-18 NOTE — Telephone Encounter (Signed)
PA for [facility] was denied. PA form proceeds to list each alternative and ask if pt has been tried and failed, or is likely to cause adverse reaction or be ineffective.   Advair Diskus or HFA, Markus Daft, Breo, Symbicort, and Trelegy.   Please advise on which one you would like for patient

## 2021-04-18 NOTE — Telephone Encounter (Signed)
Received a fax regarding Prior Authorization from [facility]) for DULERA . Authorization has been DENIED because PT HAS NOT TRIED/FAILED PREFERRED MEDICATIONS.

## 2021-04-24 NOTE — Telephone Encounter (Signed)
Spoke to patient's daughter, Arline Asp.  Arline Asp stated that patient has tried trelegy previously and patient did tolerate due to thrush.    Dr. Belia Heman, please advise. Thanks

## 2021-04-25 ENCOUNTER — Encounter: Payer: Self-pay | Admitting: Internal Medicine

## 2021-04-30 NOTE — Telephone Encounter (Signed)
Dr. Kasa, please advise. Thanks °

## 2021-05-01 NOTE — Telephone Encounter (Signed)
Spoke to patient's daughter, Arline Asp.  Arline Asp stated that patient would like to continue with duoneb and anoro for now.  Nothing further needed at this time.   Routing to Dr. Belia Heman as an fyi.

## 2021-05-01 NOTE — Addendum Note (Signed)
Addended by: Lajoyce Lauber A on: 05/01/2021 09:01 AM   Modules accepted: Orders

## 2021-05-02 ENCOUNTER — Encounter: Payer: Self-pay | Admitting: Internal Medicine

## 2021-05-16 DIAGNOSIS — J441 Chronic obstructive pulmonary disease with (acute) exacerbation: Secondary | ICD-10-CM | POA: Diagnosis not present

## 2021-05-16 DIAGNOSIS — J440 Chronic obstructive pulmonary disease with acute lower respiratory infection: Secondary | ICD-10-CM | POA: Diagnosis not present

## 2021-05-16 DIAGNOSIS — J209 Acute bronchitis, unspecified: Secondary | ICD-10-CM | POA: Diagnosis not present

## 2021-05-16 DIAGNOSIS — R058 Other specified cough: Secondary | ICD-10-CM | POA: Diagnosis not present

## 2021-05-23 ENCOUNTER — Encounter: Payer: Self-pay | Admitting: Internal Medicine

## 2021-05-23 NOTE — Telephone Encounter (Signed)
Dr. Mortimer Fries, please advise on prednisone Rx. Rx was written as prednisone 20mg  daily for 10 days #10 with 3 refills.  Patient is questioning if he should take this as needed or daily. I do not see this mentioned in last OV note.

## 2021-05-29 DIAGNOSIS — Z08 Encounter for follow-up examination after completed treatment for malignant neoplasm: Secondary | ICD-10-CM | POA: Diagnosis not present

## 2021-05-29 DIAGNOSIS — C44329 Squamous cell carcinoma of skin of other parts of face: Secondary | ICD-10-CM | POA: Diagnosis not present

## 2021-05-29 DIAGNOSIS — L570 Actinic keratosis: Secondary | ICD-10-CM | POA: Diagnosis not present

## 2021-05-29 DIAGNOSIS — L538 Other specified erythematous conditions: Secondary | ICD-10-CM | POA: Diagnosis not present

## 2021-05-29 DIAGNOSIS — L814 Other melanin hyperpigmentation: Secondary | ICD-10-CM | POA: Diagnosis not present

## 2021-05-29 DIAGNOSIS — D485 Neoplasm of uncertain behavior of skin: Secondary | ICD-10-CM | POA: Diagnosis not present

## 2021-05-29 DIAGNOSIS — L821 Other seborrheic keratosis: Secondary | ICD-10-CM | POA: Diagnosis not present

## 2021-05-29 DIAGNOSIS — L820 Inflamed seborrheic keratosis: Secondary | ICD-10-CM | POA: Diagnosis not present

## 2021-05-29 DIAGNOSIS — Z85828 Personal history of other malignant neoplasm of skin: Secondary | ICD-10-CM | POA: Diagnosis not present

## 2021-05-29 DIAGNOSIS — D225 Melanocytic nevi of trunk: Secondary | ICD-10-CM | POA: Diagnosis not present

## 2021-06-10 ENCOUNTER — Encounter: Payer: Self-pay | Admitting: Internal Medicine

## 2021-06-11 NOTE — Telephone Encounter (Signed)
Patient is requesting refill on Anoro, Ruthe Mannan and Duoneb  Dr. Mortimer Fries, please advise.

## 2021-06-16 DIAGNOSIS — J441 Chronic obstructive pulmonary disease with (acute) exacerbation: Secondary | ICD-10-CM | POA: Diagnosis not present

## 2021-06-16 DIAGNOSIS — J440 Chronic obstructive pulmonary disease with acute lower respiratory infection: Secondary | ICD-10-CM | POA: Diagnosis not present

## 2021-06-16 DIAGNOSIS — J209 Acute bronchitis, unspecified: Secondary | ICD-10-CM | POA: Diagnosis not present

## 2021-06-16 DIAGNOSIS — R058 Other specified cough: Secondary | ICD-10-CM | POA: Diagnosis not present

## 2021-07-11 ENCOUNTER — Other Ambulatory Visit: Payer: Self-pay

## 2021-07-11 ENCOUNTER — Ambulatory Visit (INDEPENDENT_AMBULATORY_CARE_PROVIDER_SITE_OTHER): Payer: Medicare HMO | Admitting: Emergency Medicine

## 2021-07-11 ENCOUNTER — Encounter: Payer: Self-pay | Admitting: Emergency Medicine

## 2021-07-11 VITALS — BP 122/80 | HR 74 | Ht 68.0 in | Wt 216.0 lb

## 2021-07-11 DIAGNOSIS — Z87898 Personal history of other specified conditions: Secondary | ICD-10-CM

## 2021-07-11 DIAGNOSIS — Z7689 Persons encountering health services in other specified circumstances: Secondary | ICD-10-CM

## 2021-07-11 DIAGNOSIS — E785 Hyperlipidemia, unspecified: Secondary | ICD-10-CM | POA: Diagnosis not present

## 2021-07-11 DIAGNOSIS — R7303 Prediabetes: Secondary | ICD-10-CM | POA: Diagnosis not present

## 2021-07-11 DIAGNOSIS — Z8709 Personal history of other diseases of the respiratory system: Secondary | ICD-10-CM | POA: Diagnosis not present

## 2021-07-11 DIAGNOSIS — R202 Paresthesia of skin: Secondary | ICD-10-CM | POA: Insufficient documentation

## 2021-07-11 LAB — COMPREHENSIVE METABOLIC PANEL
ALT: 16 U/L (ref 0–53)
AST: 19 U/L (ref 0–37)
Albumin: 4.2 g/dL (ref 3.5–5.2)
Alkaline Phosphatase: 69 U/L (ref 39–117)
BUN: 15 mg/dL (ref 6–23)
CO2: 27 mEq/L (ref 19–32)
Calcium: 9.4 mg/dL (ref 8.4–10.5)
Chloride: 104 mEq/L (ref 96–112)
Creatinine, Ser: 0.96 mg/dL (ref 0.40–1.50)
GFR: 70.75 mL/min (ref >60.00)
Glucose, Bld: 98 mg/dL (ref 70–99)
Potassium: 4.5 mEq/L (ref 3.5–5.1)
Sodium: 139 mEq/L (ref 135–145)
Total Bilirubin: 0.6 mg/dL (ref 0.2–1.2)
Total Protein: 6.2 g/dL (ref 6.0–8.3)

## 2021-07-11 LAB — CBC WITH DIFFERENTIAL/PLATELET
Basophils Absolute: 0.1 10*3/uL (ref 0.0–0.1)
Basophils Relative: 1.3 % (ref 0.0–3.0)
Eosinophils Absolute: 0.2 10*3/uL (ref 0.0–0.7)
Eosinophils Relative: 2.3 % (ref 0.0–5.0)
HCT: 45 % (ref 39.0–52.0)
Hemoglobin: 14.9 g/dL (ref 13.0–17.0)
Lymphocytes Relative: 33.2 % (ref 12.0–46.0)
Lymphs Abs: 2.7 10*3/uL (ref 0.7–4.0)
MCHC: 33.1 g/dL (ref 30.0–36.0)
MCV: 89.5 fl (ref 78.0–100.0)
Monocytes Absolute: 0.6 10*3/uL (ref 0.1–1.0)
Monocytes Relative: 7.8 % (ref 3.0–12.0)
Neutro Abs: 4.6 10*3/uL (ref 1.4–7.7)
Neutrophils Relative %: 55.4 % (ref 43.0–77.0)
Platelets: 252 10*3/uL (ref 150.0–400.0)
RBC: 5.03 Mil/uL (ref 4.22–5.81)
RDW: 12.6 % (ref 11.5–15.5)
WBC: 8.2 10*3/uL (ref 4.0–10.5)

## 2021-07-11 LAB — HEMOGLOBIN A1C: Hgb A1c MFr Bld: 5.9 % (ref 4.6–6.5)

## 2021-07-11 LAB — LIPID PANEL
Cholesterol: 244 mg/dL — ABNORMAL HIGH (ref 0–200)
HDL: 59.2 mg/dL (ref >39.00)
NonHDL: 184.49
Total CHOL/HDL Ratio: 4
Triglycerides: 213 mg/dL — ABNORMAL HIGH (ref 0.0–149.0)
VLDL: 42.6 mg/dL — ABNORMAL HIGH (ref 0.0–40.0)

## 2021-07-11 LAB — VITAMIN B12: Vitamin B-12: 238 pg/mL (ref 211–911)

## 2021-07-11 LAB — LDL CHOLESTEROL, DIRECT: Direct LDL: 158 mg/dL

## 2021-07-11 LAB — PSA: PSA: 38.01 ng/mL — ABNORMAL HIGH (ref 0.10–4.00)

## 2021-07-11 NOTE — Assessment & Plan Note (Signed)
Stable.  Continue present inhalers. ?

## 2021-07-11 NOTE — Assessment & Plan Note (Addendum)
Differential diagnosis discussed. ?Blood work done. ?Also has appointment to see podiatrist next month. ?

## 2021-07-11 NOTE — Assessment & Plan Note (Signed)
Stable.  Diet and nutrition discussed. 

## 2021-07-11 NOTE — Assessment & Plan Note (Addendum)
Patient requesting PSA.  Advised regarding significance of results ?

## 2021-07-11 NOTE — Progress Notes (Signed)
Brian Hopkins ?88 y.o. ? ? ?Chief Complaint  ?Patient presents with  ? New Patient (Initial Visit)  ?  Pt would like discuss tingling in feet and calf. Would like to get PSA lab work  ? Medication Refill  ?  Tessalon for cough  ? ? ?HISTORY OF PRESENT ILLNESS: ?This is a 88 y.o. male first visit to this office, here to establish care with me. ?Accompanied by daughter. ?Patient has history of COPD on inhalers.  Sees pulmonary doctor on a regular basis. ?Has occasional tingling in both feet and calves ?Requesting blood work ?Also requesting prescription for Tessalon. ?No other complaints or medical concerns today ? ?HPI ? ? ?Prior to Admission medications   ?Medication Sig Start Date End Date Taking? Authorizing Provider  ?acetaminophen (TYLENOL) 650 MG CR tablet Take 1,300 mg by mouth every 8 (eight) hours as needed for pain.   Yes [provider]  ?albuterol (VENTOLIN HFA) 108 (90 Base) MCG/ACT inhaler Inhale 2 puffs into the lungs every 4 (four) hours as needed for shortness of breath. 03/29/21  Yes Flora Lipps, MD  ?azelastine (ASTELIN) 0.1 % nasal spray Place 2 sprays into both nostrils 2 (two) times daily. 03/29/21  Yes Flora Lipps, MD  ?benzonatate (TESSALON) 200 MG capsule Take 200 mg by mouth 3 (three) times daily as needed for cough. 02/27/21  Yes [provider]  ?Calcium-Magnesium-Zinc 333-133-5 MG TABS Take 1 tablet by mouth daily.   Yes [provider]  ?Cholecalciferol 125 MCG (5000 UT) TABS Take 5,000 Units by mouth daily.   Yes [provider]  ?famotidine (PEPCID) 20 MG tablet Take 20 mg by mouth daily as needed for indigestion.   Yes [provider]  ?fluticasone (FLONASE) 50 MCG/ACT nasal spray Place 1 spray into both nostrils daily. 03/29/21 03/29/22 Yes Kasa, [name], MD  ?ipratropium-albuterol (DUONEB) 0.5-2.5 (3) MG/3ML SOLN Inhale 3 mLs into the lungs every 6 (six) hours as needed. 03/29/21  Yes Flora Lipps, MD  ?Multiple Vitamins-Minerals (CENTRUM  ADULTS PO) Take 2 capsules by mouth every morning.   Yes [provider]  ?polyethylene glycol powder (GLYCOLAX/MIRALAX) 17 GM/SCOOP powder Take 17 g by mouth daily as needed for constipation. 02/27/21  Yes [provider]  ?predniSONE (DELTASONE) 20 MG tablet Take 1 tablet (20 mg total) by mouth daily with breakfast. 10 days 03/29/21  Yes Kasa, [name], MD  ?Saw Palmetto 1000 MG CAPS Take 1 tablet by mouth every morning.   Yes [provider]  ?umeclidinium-vilanterol (ANORO ELLIPTA) 62.5-25 MCG/ACT AEPB Inhale 1 puff into the lungs daily. 03/29/21  Yes Flora Lipps, MD  ?azithromycin (ZITHROMAX Z-PAK) 250 MG tablet Take 2 tablets on Day 1 and then 1 tablet daily till gone. ?Patient not taking: Reported on 07/11/2021 03/29/21   Flora Lipps, MD  ?methocarbamol (ROBAXIN) 750 MG tablet Take 750 mg by mouth in the morning, at noon, and at bedtime. 02/27/21   [provider]  ?predniSONE (STERAPRED UNI-PAK 21 TAB) 10 MG (21) TBPK tablet Take by mouth daily. Take 6 tabs by mouth daily  for 2 days, then 5 tabs for 2 days, then 4 tabs for 2 days, then 3 tabs for 2 days, 2 tabs for 2 days, then 1 tab by mouth daily for 2 days 03/13/21   Isla Pence, MD  ? ? ?Allergies  ?Allergen Reactions  ? Sulfonamide Derivatives Other (See Comments)  ?  UNK reaction from childhood  ? ? ?Patient Active Problem List  ? Diagnosis Date  Noted  ? HYPERLIPIDEMIA 12/16/2006  ? ? ?Past Medical History:  ?Diagnosis Date  ? COPD (chronic obstructive pulmonary disease) ([place])   ? ? ?Past Surgical History:  ?Procedure Laterality Date  ? APPENDECTOMY    ? CHOLECYSTECTOMY    ? ? ?Social History  ? ?Socioeconomic History  ? Marital status: Widowed  ?  Spouse name: Not on file  ? Number of children: Not on file  ? Years of education: Not on file  ? Highest education level: Not on file  ?Occupational History  ? Not on file  ?Tobacco Use  ? Smoking status: Former  ?  Types: Cigarettes  ? Smokeless tobacco: Never  ?Substance  and Sexual Activity  ? Alcohol use: Not Currently  ? Drug use: Never  ? Sexual activity: Not on file  ?Other Topics Concern  ? Not on file  ?Social History Narrative  ? Not on file  ? ?Social Determinants of Health  ? ?Financial Resource Strain: Not on file  ?Food Insecurity: Not on file  ?Transportation Needs: Not on file  ?Physical Activity: Not on file  ?Stress: Not on file  ?Social Connections: Not on file  ?Intimate Partner Violence: Not on file  ? ? ?History reviewed. No pertinent family history. ? ? ?Review of Systems  ?Constitutional: Negative.  Negative for chills and fever.  ?HENT: Negative.  Negative for congestion and sore throat.   ?Respiratory: Negative.  Negative for cough and shortness of breath.   ?Cardiovascular: Negative.  Negative for chest pain and palpitations.  ?Gastrointestinal:  Negative for abdominal pain, diarrhea, nausea and vomiting.  ?Genitourinary: Negative.   ?Skin: Negative.  Negative for rash.  ?Neurological: Negative.  Negative for dizziness and headaches.  ?All other systems reviewed and are negative. ? ?Today's Vitals  ? 07/11/21 1056  ?Weight: 216 lb (98 kg)  ?Height: 5\' 8"  (1.727 m)  ? ?Body mass index is 32.84 kg/m?. ?Wt Readings from Last 3 Encounters:  ?07/11/21 216 lb (98 kg)  ?03/29/21 216 lb 3.2 oz (98.1 kg)  ?03/13/21 210 lb (95.3 kg)  ? ? ?Physical Exam ?Vitals reviewed.  ?Constitutional:   ?   Appearance: Normal appearance.  ?HENT:  ?   Head: Normocephalic.  ?Eyes:  ?   Extraocular Movements: Extraocular movements intact.  ?   Pupils: Pupils are equal, round, and reactive to light.  ?Cardiovascular:  ?   Rate and Rhythm: Normal rate and regular rhythm.  ?   Pulses: Normal pulses.  ?   Heart sounds: Normal heart sounds.  ?Pulmonary:  ?   Effort: Pulmonary effort is normal.  ?   Breath sounds: Normal breath sounds.  ?Abdominal:  ?   Palpations: Abdomen is soft.  ?   Tenderness: There is no abdominal tenderness.  ?Musculoskeletal:  ?   Cervical back: No tenderness.  ?    Comments: Feet: No erythema or ecchymosis.  Warm to touch.  Good distal peripheral pulses.  Good distal sensation.  ?Lymphadenopathy:  ?   Cervical: No cervical adenopathy.  ?Skin: ?   General: Skin is warm and dry.  ?   Capillary Refill: Capillary refill takes less than 2 seconds.  ?Neurological:  ?   General: No focal deficit present.  ?   Mental Status: He is alert and oriented to person, place, and time.  ?Psychiatric:     ?   Mood and Affect: Mood normal.     ?   Behavior: Behavior normal.  ? ? ? ?ASSESSMENT & PLAN: ?  A total of 50 minutes was spent with the patient and counseling/coordination of care regarding preparing for this visit, review of available medical records today, review of most recent blood work results, review of all medications, review of multiple chronic medical conditions and their management, comprehensive history and physical examination, education on nutrition, prognosis, documentation and need for follow-up. ? ?Problem List Items Addressed This Visit   ? ?  ? Other  ? Dyslipidemia  ?  Stable.  Lipid profile done today.  Not presently on medication. ?  ?  ? Relevant Orders  ? Lipid panel  ? Paresthesia of both lower extremities - Primary  ?  Differential diagnosis discussed. ?Blood work done. ?Also has appointment to see podiatrist next month. ?  ?  ? Relevant Orders  ? Vitamin B12  ? Hemoglobin A1c  ? CBC with Differential/Platelet  ? Comprehensive metabolic panel  ? History of elevated PSA  ?  Patient requesting PSA.  Advised regarding significance of results ?  ?  ? Relevant Orders  ? PSA  ? Prediabetes  ?  Stable.  Diet and nutrition discussed. ? ?  ?  ? Relevant Orders  ? Hemoglobin A1c  ? History of COPD  ?  Stable.  Continue present inhalers. ?  ?  ? ?Other Visit Diagnoses   ? ? Encounter to establish care      ? ?  ? ?Patient Instructions  ?Health Maintenance After Age 45 ?After age 32, you are at a higher risk for certain long-term diseases and infections as well as injuries from  falls. Falls are a major cause of broken bones and head injuries in people who are older than age 75. Getting regular preventive care can help to keep you healthy and well. Preventive care includes getti

## 2021-07-11 NOTE — Patient Instructions (Signed)
Health Maintenance After Age 86 After age 86, you are at a higher risk for certain long-term diseases and infections as well as injuries from falls. Falls are a major cause of broken bones and head injuries in people who are older than age 86. Getting regular preventive care can help to keep you healthy and well. Preventive care includes getting regular testing and making lifestyle changes as recommended by your health care provider. Talk with your health care provider about: Which screenings and tests you should have. A screening is a test that checks for a disease when you have no symptoms. A diet and exercise plan that is right for you. What should I know about screenings and tests to prevent falls? Screening and testing are the best ways to find a health problem early. Early diagnosis and treatment give you the best chance of managing medical conditions that are common after age 86. Certain conditions and lifestyle choices may make you more likely to have a fall. Your health care provider may recommend: Regular vision checks. Poor vision and conditions such as cataracts can make you more likely to have a fall. If you wear glasses, make sure to get your prescription updated if your vision changes. Medicine review. Work with your health care provider to regularly review all of the medicines you are taking, including over-the-counter medicines. Ask your health care provider about any side effects that may make you more likely to have a fall. Tell your health care provider if any medicines that you take make you feel dizzy or sleepy. Strength and balance checks. Your health care provider may recommend certain tests to check your strength and balance while standing, walking, or changing positions. Foot health exam. Foot pain and numbness, as well as not wearing proper footwear, can make you more likely to have a fall. Screenings, including: Osteoporosis screening. Osteoporosis is a condition that causes  the bones to get weaker and break more easily. Blood pressure screening. Blood pressure changes and medicines to control blood pressure can make you feel dizzy. Depression screening. You may be more likely to have a fall if you have a fear of falling, feel depressed, or feel unable to do activities that you used to do. Alcohol use screening. Using too much alcohol can affect your balance and may make you more likely to have a fall. Follow these instructions at home: Lifestyle Do not drink alcohol if: Your health care provider tells you not to drink. If you drink alcohol: Limit how much you have to: 0-1 drink a day for women. 0-2 drinks a day for men. Know how much alcohol is in your drink. In the [place] one drink equals one 12 oz bottle of beer (355 mL), one 5 oz glass of wine (148 mL), or one 1 oz glass of hard liquor (44 mL). Do not use any products that contain nicotine or tobacco. These products include cigarettes, chewing tobacco, and vaping devices, such as e-cigarettes. If you need help quitting, ask your health care provider. Activity  Follow a regular exercise program to stay fit. This will help you maintain your balance. Ask your health care provider what types of exercise are appropriate for you. If you need a cane or walker, use it as recommended by your health care provider. Wear supportive shoes that have nonskid soles. Safety  Remove any tripping hazards, such as rugs, cords, and clutter. Install safety equipment such as grab bars in bathrooms and safety rails on stairs. Keep rooms and walkways   well-lit. General instructions Talk with your health care provider about your risks for falling. Tell your health care provider if: You fall. Be sure to tell your health care provider about all falls, even ones that seem minor. You feel dizzy, tiredness (fatigue), or off-balance. Take over-the-counter and prescription medicines only as told by your health care provider. These include  supplements. Eat a healthy diet and maintain a healthy weight. A healthy diet includes low-fat dairy products, low-fat (lean) meats, and fiber from whole grains, beans, and lots of fruits and vegetables. Stay current with your vaccines. Schedule regular health, dental, and eye exams. Summary Having a healthy lifestyle and getting preventive care can help to protect your health and wellness after age 86. Screening and testing are the best way to find a health problem early and help you avoid having a fall. Early diagnosis and treatment give you the best chance for managing medical conditions that are more common for people who are older than age 86. Falls are a major cause of broken bones and head injuries in people who are older than age 86. Take precautions to prevent a fall at home. Work with your health care provider to learn what changes you can make to improve your health and wellness and to prevent falls. This information is not intended to replace advice given to you by your health care provider. Make sure you discuss any questions you have with your health care provider. Document Revised: 08/28/2020 Document Reviewed: 08/28/2020 Elsevier Patient Education  2022 [employer]  

## 2021-07-11 NOTE — Assessment & Plan Note (Signed)
Stable.  Lipid profile done today.  Not presently on medication. ?

## 2021-07-12 ENCOUNTER — Other Ambulatory Visit: Payer: Self-pay | Admitting: Emergency Medicine

## 2021-07-12 DIAGNOSIS — R972 Elevated prostate specific antigen [PSA]: Secondary | ICD-10-CM

## 2021-07-12 MED ORDER — TRAMADOL HCL 50 MG PO TABS: 50.0000 mg | ORAL_TABLET | Freq: Two times a day (BID) | ORAL | 0 refills | Status: AC | PRN

## 2021-07-12 MED ORDER — HYDROXYZINE HCL 25 MG PO TABS: 25.0000 mg | ORAL_TABLET | Freq: Three times a day (TID) | ORAL | 1 refills | Status: DC | PRN

## 2021-07-12 NOTE — Telephone Encounter (Signed)
He can use tramadol only as needed and not every day.  I will send a new prescription to pharmacy of record. ?Hydroxyzine is an antihistamine that can be used for panic attacks but it can also make you sleepy.  May take with caution.  He is 88 years old.  New prescription sent to pharmacy of record.  Thanks.

## 2021-07-14 DIAGNOSIS — R058 Other specified cough: Secondary | ICD-10-CM | POA: Diagnosis not present

## 2021-07-14 DIAGNOSIS — J441 Chronic obstructive pulmonary disease with (acute) exacerbation: Secondary | ICD-10-CM | POA: Diagnosis not present

## 2021-07-14 DIAGNOSIS — J209 Acute bronchitis, unspecified: Secondary | ICD-10-CM | POA: Diagnosis not present

## 2021-07-14 DIAGNOSIS — J440 Chronic obstructive pulmonary disease with acute lower respiratory infection: Secondary | ICD-10-CM | POA: Diagnosis not present

## 2021-07-16 DIAGNOSIS — H43391 Other vitreous opacities, right eye: Secondary | ICD-10-CM | POA: Diagnosis not present

## 2021-07-16 DIAGNOSIS — H353223 Exudative age-related macular degeneration, left eye, with inactive scar: Secondary | ICD-10-CM | POA: Diagnosis not present

## 2021-07-16 DIAGNOSIS — H43813 Vitreous degeneration, bilateral: Secondary | ICD-10-CM | POA: Diagnosis not present

## 2021-07-16 DIAGNOSIS — H353211 Exudative age-related macular degeneration, right eye, with active choroidal neovascularization: Secondary | ICD-10-CM | POA: Diagnosis not present

## 2021-07-16 DIAGNOSIS — H35033 Hypertensive retinopathy, bilateral: Secondary | ICD-10-CM | POA: Diagnosis not present

## 2021-08-14 DIAGNOSIS — J209 Acute bronchitis, unspecified: Secondary | ICD-10-CM | POA: Diagnosis not present

## 2021-08-14 DIAGNOSIS — R058 Other specified cough: Secondary | ICD-10-CM | POA: Diagnosis not present

## 2021-08-14 DIAGNOSIS — J441 Chronic obstructive pulmonary disease with (acute) exacerbation: Secondary | ICD-10-CM | POA: Diagnosis not present

## 2021-08-14 DIAGNOSIS — J440 Chronic obstructive pulmonary disease with acute lower respiratory infection: Secondary | ICD-10-CM | POA: Diagnosis not present

## 2021-08-30 DIAGNOSIS — H35033 Hypertensive retinopathy, bilateral: Secondary | ICD-10-CM | POA: Diagnosis not present

## 2021-08-30 DIAGNOSIS — H353211 Exudative age-related macular degeneration, right eye, with active choroidal neovascularization: Secondary | ICD-10-CM | POA: Diagnosis not present

## 2021-08-30 DIAGNOSIS — H353223 Exudative age-related macular degeneration, left eye, with inactive scar: Secondary | ICD-10-CM | POA: Diagnosis not present

## 2021-08-30 DIAGNOSIS — H43813 Vitreous degeneration, bilateral: Secondary | ICD-10-CM | POA: Diagnosis not present

## 2021-09-13 DIAGNOSIS — R058 Other specified cough: Secondary | ICD-10-CM | POA: Diagnosis not present

## 2021-09-13 DIAGNOSIS — J209 Acute bronchitis, unspecified: Secondary | ICD-10-CM | POA: Diagnosis not present

## 2021-09-13 DIAGNOSIS — J441 Chronic obstructive pulmonary disease with (acute) exacerbation: Secondary | ICD-10-CM | POA: Diagnosis not present

## 2021-09-13 DIAGNOSIS — J440 Chronic obstructive pulmonary disease with acute lower respiratory infection: Secondary | ICD-10-CM | POA: Diagnosis not present

## 2021-09-16 ENCOUNTER — Other Ambulatory Visit: Payer: Self-pay | Admitting: Emergency Medicine

## 2021-09-16 ENCOUNTER — Other Ambulatory Visit: Payer: Self-pay | Admitting: Internal Medicine

## 2021-09-16 DIAGNOSIS — J449 Chronic obstructive pulmonary disease, unspecified: Secondary | ICD-10-CM

## 2021-09-18 ENCOUNTER — Encounter: Payer: Self-pay | Admitting: Internal Medicine

## 2021-09-18 DIAGNOSIS — G4719 Other hypersomnia: Secondary | ICD-10-CM

## 2021-09-18 DIAGNOSIS — J449 Chronic obstructive pulmonary disease, unspecified: Secondary | ICD-10-CM

## 2021-09-18 NOTE — Telephone Encounter (Signed)
Dr. Belia Heman, please advise. Patient would like a Rx of prednisone to keep on hand.

## 2021-09-19 ENCOUNTER — Telehealth: Payer: Self-pay | Admitting: *Deleted

## 2021-09-19 NOTE — Telephone Encounter (Signed)
PA for hydroxyzine submitted, awaiting response Key: [id number]

## 2021-09-19 NOTE — Telephone Encounter (Signed)
PA for hydroxyzine approved

## 2021-09-24 ENCOUNTER — Ambulatory Visit: Payer: Self-pay | Admitting: Podiatry

## 2021-09-26 MED ORDER — PREDNISONE 20 MG PO TABS: 20.0000 mg | ORAL_TABLET | Freq: Every day | ORAL | 0 refills | Status: DC

## 2021-10-07 ENCOUNTER — Other Ambulatory Visit: Payer: Self-pay | Admitting: Internal Medicine

## 2021-10-07 DIAGNOSIS — J449 Chronic obstructive pulmonary disease, unspecified: Secondary | ICD-10-CM

## 2021-10-08 ENCOUNTER — Encounter: Payer: Self-pay | Admitting: Internal Medicine

## 2021-10-08 MED ORDER — ALBUTEROL SULFATE HFA 108 (90 BASE) MCG/ACT IN AERS: 2.0000 | INHALATION_SPRAY | RESPIRATORY_TRACT | 12 refills | Status: DC | PRN

## 2021-10-09 NOTE — Telephone Encounter (Signed)
Dr. Kasa, please advise. Thanks °

## 2021-10-11 DIAGNOSIS — H353211 Exudative age-related macular degeneration, right eye, with active choroidal neovascularization: Secondary | ICD-10-CM | POA: Diagnosis not present

## 2021-10-11 DIAGNOSIS — H43813 Vitreous degeneration, bilateral: Secondary | ICD-10-CM | POA: Diagnosis not present

## 2021-10-11 DIAGNOSIS — H353221 Exudative age-related macular degeneration, left eye, with active choroidal neovascularization: Secondary | ICD-10-CM | POA: Diagnosis not present

## 2021-10-11 DIAGNOSIS — H35033 Hypertensive retinopathy, bilateral: Secondary | ICD-10-CM | POA: Diagnosis not present

## 2021-10-14 DIAGNOSIS — J441 Chronic obstructive pulmonary disease with (acute) exacerbation: Secondary | ICD-10-CM | POA: Diagnosis not present

## 2021-10-14 DIAGNOSIS — J209 Acute bronchitis, unspecified: Secondary | ICD-10-CM | POA: Diagnosis not present

## 2021-10-14 DIAGNOSIS — R058 Other specified cough: Secondary | ICD-10-CM | POA: Diagnosis not present

## 2021-10-14 DIAGNOSIS — J440 Chronic obstructive pulmonary disease with acute lower respiratory infection: Secondary | ICD-10-CM | POA: Diagnosis not present

## 2021-10-29 ENCOUNTER — Encounter: Payer: Self-pay | Admitting: Internal Medicine

## 2021-10-29 DIAGNOSIS — J449 Chronic obstructive pulmonary disease, unspecified: Secondary | ICD-10-CM

## 2021-10-29 NOTE — Telephone Encounter (Signed)
Routing to Dr. Belia Heman as an FYI per patient request.  Patient would like refill on prednisone and zpak to keep on hand.

## 2021-11-05 MED ORDER — PREDNISONE 20 MG PO TABS: 20.0000 mg | ORAL_TABLET | Freq: Every day | ORAL | 3 refills | Status: DC

## 2021-11-05 MED ORDER — AZITHROMYCIN 250 MG PO TABS: ORAL_TABLET | ORAL | 0 refills | Status: DC

## 2021-11-13 ENCOUNTER — Emergency Department (HOSPITAL_COMMUNITY): Payer: Medicare HMO

## 2021-11-13 ENCOUNTER — Emergency Department (HOSPITAL_COMMUNITY)
Admission: EM | Admit: 2021-11-13 | Discharge: 2021-11-13 | Disposition: A | Discharge status: Home / Self Care | Payer: Medicare HMO | Attending: Emergency Medicine | Admitting: Emergency Medicine

## 2021-11-13 ENCOUNTER — Encounter: Payer: Self-pay | Admitting: Emergency Medicine

## 2021-11-13 ENCOUNTER — Encounter: Payer: Self-pay | Admitting: Internal Medicine

## 2021-11-13 ENCOUNTER — Encounter (HOSPITAL_COMMUNITY): Payer: Self-pay

## 2021-11-13 DIAGNOSIS — J440 Chronic obstructive pulmonary disease with acute lower respiratory infection: Secondary | ICD-10-CM | POA: Diagnosis not present

## 2021-11-13 DIAGNOSIS — J441 Chronic obstructive pulmonary disease with (acute) exacerbation: Secondary | ICD-10-CM | POA: Diagnosis not present

## 2021-11-13 DIAGNOSIS — R0602 Shortness of breath: Secondary | ICD-10-CM | POA: Diagnosis not present

## 2021-11-13 DIAGNOSIS — R911 Solitary pulmonary nodule: Secondary | ICD-10-CM | POA: Diagnosis not present

## 2021-11-13 DIAGNOSIS — J209 Acute bronchitis, unspecified: Secondary | ICD-10-CM | POA: Diagnosis not present

## 2021-11-13 DIAGNOSIS — Z7951 Long term (current) use of inhaled steroids: Secondary | ICD-10-CM | POA: Diagnosis not present

## 2021-11-13 DIAGNOSIS — G4733 Obstructive sleep apnea (adult) (pediatric): Secondary | ICD-10-CM | POA: Diagnosis not present

## 2021-11-13 DIAGNOSIS — J439 Emphysema, unspecified: Secondary | ICD-10-CM | POA: Diagnosis not present

## 2021-11-13 DIAGNOSIS — R058 Other specified cough: Secondary | ICD-10-CM | POA: Diagnosis not present

## 2021-11-13 LAB — BASIC METABOLIC PANEL
Anion gap: 8 (ref 5–15)
BUN: 25 mg/dL — ABNORMAL HIGH (ref 8–23)
CO2: 21 mmol/L — ABNORMAL LOW (ref 22–32)
Calcium: 9.3 mg/dL (ref 8.9–10.3)
Chloride: 113 mmol/L — ABNORMAL HIGH (ref 98–111)
Creatinine, Ser: 1.15 mg/dL (ref 0.61–1.24)
GFR, Estimated: >60 mL/min (ref >60)
Glucose, Bld: 91 mg/dL (ref 70–99)
Potassium: 4.1 mmol/L (ref 3.5–5.1)
Sodium: 142 mmol/L (ref 135–145)

## 2021-11-13 LAB — CBC WITH DIFFERENTIAL/PLATELET
Abs Immature Granulocytes: 0.03 10*3/uL (ref 0.00–0.07)
Basophils Absolute: 0.1 10*3/uL (ref 0.0–0.1)
Basophils Relative: 2 %
Eosinophils Absolute: 0.6 10*3/uL — ABNORMAL HIGH (ref 0.0–0.5)
Eosinophils Relative: 7 %
HCT: 39.8 % (ref 39.0–52.0)
Hemoglobin: 13.3 g/dL (ref 13.0–17.0)
Immature Granulocytes: 0 %
Lymphocytes Relative: 33 %
Lymphs Abs: 2.6 10*3/uL (ref 0.7–4.0)
MCH: 28.9 pg (ref 26.0–34.0)
MCHC: 33.4 g/dL (ref 30.0–36.0)
MCV: 86.3 fL (ref 80.0–100.0)
Monocytes Absolute: 0.8 10*3/uL (ref 0.1–1.0)
Monocytes Relative: 10 %
Neutro Abs: 3.7 10*3/uL (ref 1.7–7.7)
Neutrophils Relative %: 48 %
Platelets: 260 10*3/uL (ref 150–400)
RBC: 4.61 MIL/uL (ref 4.22–5.81)
RDW: 13.6 % (ref 11.5–15.5)
WBC: 7.8 10*3/uL (ref 4.0–10.5)
nRBC: 0 % (ref 0.0–0.2)

## 2021-11-13 LAB — BRAIN NATRIURETIC PEPTIDE: B Natriuretic Peptide: 97.7 pg/mL (ref 0.0–100.0)

## 2021-11-13 MED ORDER — IPRATROPIUM-ALBUTEROL 0.5-2.5 (3) MG/3ML IN SOLN
3.0000 mL | Freq: Once | RESPIRATORY_TRACT | Status: AC
  Administered 2021-11-13: 3 mL via RESPIRATORY_TRACT
  Filled 2021-11-13: qty 3

## 2021-11-13 MED ORDER — STERILE WATER FOR INJECTION IJ SOLN
INTRAMUSCULAR | Status: AC
  Filled 2021-11-13: qty 10

## 2021-11-13 MED ORDER — IOHEXOL 300 MG/ML  SOLN
75.0000 mL | Freq: Once | INTRAMUSCULAR | Status: AC | PRN
  Administered 2021-11-13: 75 mL via INTRAVENOUS

## 2021-11-13 MED ORDER — ALBUTEROL SULFATE (2.5 MG/3ML) 0.083% IN NEBU
2.5000 mg | INHALATION_SOLUTION | Freq: Once | RESPIRATORY_TRACT | Status: AC
  Administered 2021-11-13: 2.5 mg via RESPIRATORY_TRACT
  Filled 2021-11-13: qty 3

## 2021-11-13 MED ORDER — METHYLPREDNISOLONE SODIUM SUCC 125 MG IJ SOLR
125.0000 mg | Freq: Once | INTRAMUSCULAR | Status: AC
Start: 2021-11-13 — End: 2021-11-13
  Administered 2021-11-13: 125 mg via INTRAVENOUS
  Filled 2021-11-13: qty 2

## 2021-11-13 MED ORDER — PREDNISONE 20 MG PO TABS: 20.0000 mg | ORAL_TABLET | Freq: Two times a day (BID) | ORAL | 0 refills | Status: AC

## 2021-11-13 NOTE — Telephone Encounter (Signed)
Santiago Bumpers Clendenen "Bill"  P [facility] (supporting Erin Fulling, MD) 20 minutes ago (4:06 PM)    Update on dad. He still has this persistent cough. His right bronchial is weezing more, even with nebulizer and rescue inhaler. The mucus is very productive in his chest. He is holding off on the new prednisone prescription that u called in, but his breathing is getting more labored. Suggestions?    Dr. Belia Heman, please advise. Thanks

## 2021-11-13 NOTE — Discharge Instructions (Signed)
Your x-rays and CT scan show no sign of pneumonia.  Your symptoms are likely due to your COPD.  Take the medications as written, follow-up with your pulmonologist within the week.  If you have worsening symptoms or difficulty breathing again, return back to the ER anytime.

## 2021-11-13 NOTE — ED Provider Notes (Signed)
[facility]-EMERGENCY DEPT Provider Note   CSN: [id number] Arrival date & time: 11/13/21  1747     History  Chief Complaint  Patient presents with   Shortness of Breath    Brian Hopkins is a 88 y.o. male.  Chief complaint shortness of breath.  Ongoing for months.  Concern for persistent wheezes for 1 month.  No new fevers.  Persistent cough unchanged for months.  No chest pain no abdominal pain no headache.  He has a pulmonologist with whom he is having a hard time getting an appointment and presents to the ER.       Home Medications Prior to Admission medications   Medication Sig Start Date End Date Taking? Authorizing Provider  acetaminophen (TYLENOL) 650 MG CR tablet Take 1,300 mg by mouth every 8 (eight) hours as needed for pain.   Yes [provider]  albuterol (VENTOLIN HFA) 108 (90 Base) MCG/ACT inhaler Inhale 2 puffs into the lungs every 4 (four) hours as needed for shortness of breath. 10/08/21  Yes Erin Fulling, MD  Calcium-Magnesium-Zinc [phone] MG TABS Take 1 tablet by mouth daily.   Yes [provider]  Cholecalciferol 125 MCG (5000 UT) TABS Take 5,000 Units by mouth daily.   Yes [provider]  fluticasone (FLONASE) 50 MCG/ACT nasal spray Place 1 spray into both nostrils daily. 03/29/21 03/29/22 Yes Kasa, Wallis Bamberg, MD  hydrOXYzine (ATARAX) 25 MG tablet TAKE 1 TABLET BY MOUTH EVERY 8 HOURS AS NEEDED FOR ANXIETY. 09/16/21  Yes Sagardia, Eilleen Kempf, MD  Multiple Vitamins-Minerals (CENTRUM ADULTS PO) Take 2 capsules by mouth every morning.   Yes [provider]  polyethylene glycol powder (GLYCOLAX/MIRALAX) 17 GM/SCOOP powder Take 17 g by mouth daily as needed for constipation. 02/27/21  Yes [provider]  umeclidinium-vilanterol (ANORO ELLIPTA) 62.5-25 MCG/ACT AEPB Inhale 1 puff into the lungs daily. 03/29/21  Yes Erin Fulling, MD  azelastine (ASTELIN) 0.1 % nasal spray Place 2 sprays into both nostrils 2  (two) times daily. Patient not taking: Reported on 11/13/2021 03/29/21   Erin Fulling, MD  azithromycin (ZITHROMAX) 250 MG tablet 2 tablets on day 1 followed by 1 tablet daily Patient not taking: Reported on 11/13/2021 11/05/21   Erin Fulling, MD  ipratropium-albuterol (DUONEB) 0.5-2.5 (3) MG/3ML SOLN Inhale 3 mLs into the lungs every 6 (six) hours as needed. Patient not taking: Reported on 11/13/2021 03/29/21   Erin Fulling, MD  predniSONE (DELTASONE) 20 MG tablet Take 1 tablet (20 mg total) by mouth daily with breakfast. Patient not taking: Reported on 11/13/2021 09/26/21   Erin Fulling, MD  predniSONE (DELTASONE) 20 MG tablet Take 1 tablet (20 mg total) by mouth daily with breakfast. 10 days 11/05/21   Erin Fulling, MD      Allergies    Sulfonamide derivatives    Review of Systems   Review of Systems  Constitutional:  Negative for fever.  HENT:  Negative for ear pain and sore throat.   Eyes:  Negative for pain.  Respiratory:  Positive for cough, shortness of breath and wheezing.   Cardiovascular:  Negative for chest pain.  Gastrointestinal:  Negative for abdominal pain.  Genitourinary:  Negative for flank pain.  Musculoskeletal:  Negative for back pain.  Skin:  Negative for color change and rash.  Neurological:  Negative for syncope.  All other systems reviewed and are negative.   Physical Exam Updated Vital Signs BP 125/73   Pulse 85   Temp 98 F (36.7 C) (Oral)  Resp 17   SpO2 93%  Physical Exam Constitutional:      Appearance: He is well-developed.  HENT:     Head: Normocephalic.     Nose: Nose normal.  Eyes:     Extraocular Movements: Extraocular movements intact.  Cardiovascular:     Rate and Rhythm: Normal rate.  Pulmonary:     Effort: Pulmonary effort is normal.     Breath sounds: Wheezing present.  Skin:    Coloration: Skin is not jaundiced.  Neurological:     Mental Status: He is alert. Mental status is at baseline.     ED Results / Procedures / Treatments    Labs (all labs ordered are listed, but only abnormal results are displayed) Labs Reviewed  CBC WITH DIFFERENTIAL/PLATELET - Abnormal; Notable for the following components:      Result Value   Eosinophils Absolute 0.6 (*)    All other components within normal limits  BASIC METABOLIC PANEL - Abnormal; Notable for the following components:   Chloride 113 (*)    CO2 21 (*)    BUN 25 (*)    All other components within normal limits  BRAIN NATRIURETIC PEPTIDE    EKG None  Radiology CT Chest W Contrast  Result Date: 11/13/2021 CLINICAL DATA:  Multiple lung nodules. Shortness of breath for a month. EXAM: CT CHEST WITH CONTRAST TECHNIQUE: Multidetector CT imaging of the chest was performed during intravenous contrast administration. RADIATION DOSE REDUCTION: This exam was performed according to the departmental dose-optimization program which includes automated exposure control, adjustment of the mA and/or kV according to patient size and/or use of iterative reconstruction technique. CONTRAST:  54mL OMNIPAQUE IOHEXOL 300 MG/ML  SOLN COMPARISON:  Chest radiograph 11/13/2021 and 03/13/2021 FINDINGS: Cardiovascular: Normal heart size. No pericardial effusions. Coronary artery and aortic calcification. No aortic aneurysm. Mediastinum/Nodes: Esophagus is decompressed. Mediastinal lymph nodes are not pathologically enlarged, likely reactive. Thyroid gland is unremarkable. Lungs/Pleura: Emphysematous changes in the lungs. Mild interstitial fibrosis in the lung bases. No discrete pulmonary nodules. Nodular changes suggested at plain radiograph likely representing interstitial change and subpleural lymph nodes. There is a calcified granuloma in the left lingula. Upper Abdomen: Calcified granulomas in the spleen. No acute abnormalities. Musculoskeletal: Complete collapse of a midthoracic vertebra, likely T8. Mild superior endplate compression deformities at T5 and T2. Chronicity is indeterminate without earlier  comparison studies. Degenerative changes. IMPRESSION: 1. Emphysematous changes and fibrosis in the lungs. Calcified granulomas. No significant pulmonary nodules identified. 2. Aortic atherosclerosis. 3. Thoracic vertebral compression fractures as above, age indeterminate. Electronically Signed   By: Burman Nieves M.D.   On: 11/13/2021 20:39   DG Chest 2 View  Result Date: 11/13/2021 CLINICAL DATA:  Shortness of breath EXAM: CHEST - 2 VIEW COMPARISON:  03/13/2021 FINDINGS: No acute airspace disease or pleural effusion. Normal cardiac size. No pneumothorax. Interval punctate appearing nodularity in the right perihilar region. Left lower probable granuloma. Severe compression fracture of the lower thoracic region of uncertain age IMPRESSION: 1. No acute airspace disease 2. Possible punctate nodules in the right perihilar region. Suggest chest CT for further assessment. 3. Severe compression fracture of lower thoracic vertebra of uncertain age Electronically Signed   By: Jasmine Pang M.D.   On: 11/13/2021 19:02    Procedures .Critical Care  Performed by: Cheryll Cockayne, MD Authorized by: Cheryll Cockayne, MD   Critical care provider statement:    Critical care time (minutes):  40   Critical care time was exclusive of:  Separately billable procedures and treating other patients and teaching time   Critical care was necessary to treat or prevent imminent or life-threatening deterioration of the following conditions:  Respiratory failure     Medications Ordered in ED Medications  methylPREDNISolone sodium succinate (SOLU-MEDROL) 125 mg/2 mL injection 125 mg (125 mg Intravenous Given 11/13/21 1859)  ipratropium-albuterol (DUONEB) 0.5-2.5 (3) MG/3ML nebulizer solution 3 mL (3 mLs Nebulization Given 11/13/21 1859)  sterile water (preservative free) injection (  Given 11/13/21 1859)  iohexol (OMNIPAQUE) 300 MG/ML solution 75 mL (75 mLs Intravenous Contrast Given 11/13/21 2017)  albuterol (PROVENTIL) (2.5  MG/3ML) 0.083% nebulizer solution 2.5 mg (2.5 mg Nebulization Given 11/13/21 2038)    ED Course/ Medical Decision Making/ A&P                           Medical Decision Making Amount and/or Complexity of Data Reviewed Labs: ordered. Radiology: ordered.  Risk Prescription drug management.   History obtained from family at bedside.  Cardiac monitoring showing sinus rhythm.  Review of record shows outpatient messaging with his doctors October 29, 2021.  Work-up today included labs White count normal hemoglobin normal chemistry unremarkable.  proBNP normal as well.  Chest x-ray shows possible nodules, followed up with CT chest showing no clear nodules.  Patient given breathing treatment still having symptoms, given IV Solu-Medrol as well as continuous nebulized treatment for 2 hours with significant improvement.  Patient states he feels much better way to go home.  Discharged home in stable condition advised outpatient follow-up with pulmonologist this week.  Advised return for fevers worsening symptoms or any additional concerns.        Final Clinical Impression(s) / ED Diagnoses Final diagnoses:  COPD exacerbation [facility])    Rx / DC Orders ED Discharge Orders     None         Cheryll Cockayne, MD 11/13/21 2217

## 2021-11-13 NOTE — ED Triage Notes (Signed)
Pt presents with c/o shortness of breath off and on for the last month. Pt is working to breathe in triage, has tried home medications without much relief. Pt has a hx of COPD.

## 2021-11-13 NOTE — Telephone Encounter (Signed)
Per Dr. Belia Heman via epic secure chat--Recommend ER visit/UC visits. Will need IV medications and lab work

## 2021-11-14 MED ORDER — CLONAZEPAM 0.5 MG PO TABS: 0.5000 mg | ORAL_TABLET | Freq: Two times a day (BID) | ORAL | 1 refills | Status: DC | PRN

## 2021-11-14 NOTE — Telephone Encounter (Signed)
Santiago Bumpers [place] "Bill"  P [facility] (supporting Erin Fulling, MD) 10 hours ago (9:08 PM)    Ended up at [facility] at 6pm. 2 treatments, steroid shot, xray, CT scan.  I have a question is there an anxiety med Like a .5 valium to help him with the panic attacks? If I can calm him down his breathing slows.   Dr. Belia Heman, please advise. Thanks

## 2021-11-16 NOTE — Telephone Encounter (Signed)
FYI for Dr Belia Heman:  clonazePAM 0.5 MG tablet klonopin knew I didn't spell right.     Brian Hopkins "Brian Hopkins"  P [facility] Clinical Pool (supporting Erin Fulling, MD) 21 hours ago (5:18 PM)    My gkids take .2mg  at night for sleep so I glad know it doesn't have that weird effect.     Gosselin "Brian Hopkins"  P [facility] Clinical Pool (supporting Brian Bumpers, MD) 21 hours ago (5:17 PM)    Yes put in him on cholazpam or klonodin spelling not sure lol. .5mg  but we are cutting into 1/4 and only when he gets the Anxious breathing.     Erin Fulling, CMA  [place] "Brian Hopkins" and proxy Brian Hopkins Player "Brian Hopkins") 2 days ago    Did PCP recommend a medication to help with anxiety?

## 2021-11-29 DIAGNOSIS — H353231 Exudative age-related macular degeneration, bilateral, with active choroidal neovascularization: Secondary | ICD-10-CM | POA: Diagnosis not present

## 2021-11-29 DIAGNOSIS — H35033 Hypertensive retinopathy, bilateral: Secondary | ICD-10-CM | POA: Diagnosis not present

## 2021-11-29 DIAGNOSIS — H353211 Exudative age-related macular degeneration, right eye, with active choroidal neovascularization: Secondary | ICD-10-CM | POA: Diagnosis not present

## 2021-11-29 DIAGNOSIS — H43391 Other vitreous opacities, right eye: Secondary | ICD-10-CM | POA: Diagnosis not present

## 2021-11-29 DIAGNOSIS — H43813 Vitreous degeneration, bilateral: Secondary | ICD-10-CM | POA: Diagnosis not present

## 2021-12-14 DIAGNOSIS — J440 Chronic obstructive pulmonary disease with acute lower respiratory infection: Secondary | ICD-10-CM | POA: Diagnosis not present

## 2021-12-14 DIAGNOSIS — R058 Other specified cough: Secondary | ICD-10-CM | POA: Diagnosis not present

## 2021-12-14 DIAGNOSIS — J441 Chronic obstructive pulmonary disease with (acute) exacerbation: Secondary | ICD-10-CM | POA: Diagnosis not present

## 2021-12-14 DIAGNOSIS — J209 Acute bronchitis, unspecified: Secondary | ICD-10-CM | POA: Diagnosis not present

## 2021-12-17 ENCOUNTER — Encounter: Payer: Self-pay | Admitting: Emergency Medicine

## 2021-12-18 ENCOUNTER — Encounter: Payer: Self-pay | Admitting: Emergency Medicine

## 2021-12-18 ENCOUNTER — Ambulatory Visit (INDEPENDENT_AMBULATORY_CARE_PROVIDER_SITE_OTHER): Payer: Medicare HMO | Admitting: Emergency Medicine

## 2021-12-18 VITALS — BP 136/84 | HR 72 | Temp 98.0°F | Ht 68.0 in | Wt 211.2 lb

## 2021-12-18 DIAGNOSIS — R0981 Nasal congestion: Secondary | ICD-10-CM | POA: Insufficient documentation

## 2021-12-18 DIAGNOSIS — J0100 Acute maxillary sinusitis, unspecified: Secondary | ICD-10-CM | POA: Insufficient documentation

## 2021-12-18 DIAGNOSIS — Z8709 Personal history of other diseases of the respiratory system: Secondary | ICD-10-CM

## 2021-12-18 MED ORDER — AMOXICILLIN-POT CLAVULANATE 875-125 MG PO TABS: 1.0000 | ORAL_TABLET | Freq: Two times a day (BID) | ORAL | 0 refills | Status: AC

## 2021-12-18 NOTE — Patient Instructions (Signed)

## 2021-12-18 NOTE — Progress Notes (Signed)
Brian Hopkins 88 y.o.   Chief Complaint  Patient presents with   Ear Pain    Left ear pain, muffled sounding , x 10 days    Sinus Problem    HISTORY OF PRESENT ILLNESS: Acute problem visit today. This is a 88 y.o. male complaining of possible sinus infection.  Complaining of muffled sound on left ear for the past 7 to 10 days.  Also sinus drainage.  History of COPD.  No other complaints or medical concerns today.  Sinus Problem Associated symptoms include congestion and coughing. Pertinent negatives include no chills or headaches.     Prior to Admission medications   Medication Sig Start Date End Date Taking? Authorizing Provider  acetaminophen (TYLENOL) 650 MG CR tablet Take 1,300 mg by mouth every 8 (eight) hours as needed for pain.    [provider]  albuterol (VENTOLIN HFA) 108 (90 Base) MCG/ACT inhaler Inhale 2 puffs into the lungs every 4 (four) hours as needed for shortness of breath. 10/08/21   Erin Fulling, MD  azelastine (ASTELIN) 0.1 % nasal spray Place 2 sprays into both nostrils 2 (two) times daily. Patient not taking: Reported on 11/13/2021 03/29/21   Erin Fulling, MD  azithromycin (ZITHROMAX) 250 MG tablet 2 tablets on day 1 followed by 1 tablet daily Patient not taking: Reported on 11/13/2021 11/05/21   Erin Fulling, MD  Calcium-Magnesium-Zinc [id number] MG TABS Take 1 tablet by mouth daily.    [provider]  Cholecalciferol 125 MCG (5000 UT) TABS Take 5,000 Units by mouth daily.    [provider]  clonazePAM (KLONOPIN) 0.5 MG tablet Take 1 tablet (0.5 mg total) by mouth 2 (two) times daily as needed for anxiety. 11/14/21   Corwin Levins, MD  fluticasone (FLONASE) 50 MCG/ACT nasal spray Place 1 spray into both nostrils daily. 03/29/21 03/29/22  Erin Fulling, MD  hydrOXYzine (ATARAX) 25 MG tablet TAKE 1 TABLET BY MOUTH EVERY 8 HOURS AS NEEDED FOR ANXIETY. 09/16/21   Georgina Quint, MD  ipratropium-albuterol (DUONEB) 0.5-2.5 (3) MG/3ML SOLN  Inhale 3 mLs into the lungs every 6 (six) hours as needed. Patient not taking: Reported on 11/13/2021 03/29/21   Erin Fulling, MD  Multiple Vitamins-Minerals (CENTRUM ADULTS PO) Take 2 capsules by mouth every morning.    [provider]  polyethylene glycol powder (GLYCOLAX/MIRALAX) 17 GM/SCOOP powder Take 17 g by mouth daily as needed for constipation. 02/27/21   [provider]  predniSONE (DELTASONE) 20 MG tablet Take 1 tablet (20 mg total) by mouth daily with breakfast. Patient not taking: Reported on 11/13/2021 09/26/21   Erin Fulling, MD  predniSONE (DELTASONE) 20 MG tablet Take 1 tablet (20 mg total) by mouth daily with breakfast. 10 days 11/05/21   Erin Fulling, MD  umeclidinium-vilanterol (ANORO ELLIPTA) 62.5-25 MCG/ACT AEPB Inhale 1 puff into the lungs daily. 03/29/21   Erin Fulling, MD    Allergies  Allergen Reactions   Sulfonamide Derivatives Other (See Comments)    UNK reaction from childhood    Patient Active Problem List   Diagnosis Date Noted   Paresthesia of both lower extremities 07/11/2021   History of elevated PSA 07/11/2021   Prediabetes 07/11/2021   History of COPD 07/11/2021   Dyslipidemia 12/16/2006    Past Medical History:  Diagnosis Date   COPD (chronic obstructive pulmonary disease) (HCC)     Past Surgical History:  Procedure Laterality Date   APPENDECTOMY     CHOLECYSTECTOMY      Social History  Socioeconomic History   Marital status: Widowed    Spouse name: Not on file   Number of children: Not on file   Years of education: Not on file   Highest education level: Not on file  Occupational History   Not on file  Tobacco Use   Smoking status: Former    Types: Cigarettes   Smokeless tobacco: Never  Substance and Sexual Activity   Alcohol use: Not Currently   Drug use: Never   Sexual activity: Not on file  Other Topics Concern   Not on file  Social History Narrative   Not on file   Social Determinants of Health    Financial Resource Strain: Not on file  Food Insecurity: Not on file  Transportation Needs: Not on file  Physical Activity: Not on file  Stress: Not on file  Social Connections: Not on file  Intimate Partner Violence: Not on file    History reviewed. No pertinent family history.   Review of Systems  Constitutional: Negative.  Negative for chills and fever.  HENT:  Positive for congestion and sinus pain.   Respiratory:  Positive for cough.   Cardiovascular: Negative.  Negative for chest pain and palpitations.  Gastrointestinal: Negative.  Negative for abdominal pain, diarrhea, nausea and vomiting.  Genitourinary: Negative.   Skin: Negative.  Negative for rash.  Neurological:  Negative for dizziness and headaches.  All other systems reviewed and are negative.  Today's Vitals   12/18/21 1538  BP: 136/84  Pulse: 72  Temp: 98 F (36.7 C)  TempSrc: Oral  SpO2: 94%  Weight: 211 lb 4 oz (95.8 kg)  Height: 5\' 8"  (1.727 m)   Body mass index is 32.12 kg/m.   Physical Exam Vitals reviewed.  Constitutional:      Appearance: Normal appearance.  HENT:     Head: Normocephalic.     Right Ear: Tympanic membrane, ear canal and external ear normal.     Left Ear: Tympanic membrane, ear canal and external ear normal.     Nose: Congestion present.     Right Sinus: Maxillary sinus tenderness present.     Left Sinus: Maxillary sinus tenderness present.     Mouth/Throat:     Mouth: Mucous membranes are moist.     Pharynx: Oropharynx is clear.  Eyes:     Extraocular Movements: Extraocular movements intact.     Conjunctiva/sclera: Conjunctivae normal.     Pupils: Pupils are equal, round, and reactive to light.  Cardiovascular:     Rate and Rhythm: Normal rate and regular rhythm.     Pulses: Normal pulses.     Heart sounds: Normal heart sounds.  Pulmonary:     Effort: Pulmonary effort is normal.     Breath sounds: Normal breath sounds.  Musculoskeletal:     Cervical back: No  tenderness.  Lymphadenopathy:     Cervical: No cervical adenopathy.  Skin:    General: Skin is warm and dry.     Capillary Refill: Capillary refill takes less than 2 seconds.  Neurological:     General: No focal deficit present.     Mental Status: He is alert and oriented to person, place, and time.  Psychiatric:        Mood and Affect: Mood normal.        Behavior: Behavior normal.      ASSESSMENT & PLAN: A total of 33 minutes was spent with the patient and counseling/coordination of care regarding preparing for this visit, review of  most recent office visit notes, review of multiple chronic medical problems and their management, review of all medications, diagnosis of acute sinusitis and need to start antibiotics, prognosis, documentation, need for follow-up if no better or worse during the next several days.  Problem List Items Addressed This Visit       Respiratory   Sinus congestion    May use saline nasal sprays as needed Avoid over-the-counter decongestants      Acute non-recurrent maxillary sinusitis - Primary    May benefit from antibiotic We will start Augmentin 875 mg twice a day for 7 days. Advised to contact the office if no better or worse during the next several days.      Relevant Medications   amoxicillin-clavulanate (AUGMENTIN) 875-125 MG tablet     Other   History of COPD    Stable. Continue Anoro Ellipta 1 puff daily Presently on 20 mg of prednisone daily. Albuterol as rescue inhaler as needed      Patient Instructions  Sinus Infection, Adult A sinus infection is soreness and swelling (inflammation) of your sinuses. Sinuses are hollow spaces in the bones around your face. They are located: Around your eyes. In the middle of your forehead. Behind your nose. In your cheekbones. Your sinuses and nasal passages are lined with a fluid called mucus. Mucus drains out of your sinuses. Swelling can trap mucus in your sinuses. This lets germs (bacteria,  virus, or fungus) grow, which leads to infection. Most of the time, this condition is caused by a virus. What are the causes? Allergies. Asthma. Germs. Things that block your nose or sinuses. Growths in the nose (nasal polyps). Chemicals or irritants in the air. A fungus. This is rare. What increases the risk? Having a weak body defense system (immune system). Doing a lot of swimming or diving. Using nasal sprays too much. Smoking. What are the signs or symptoms? The main symptoms of this condition are pain and a feeling of pressure around the sinuses. Other symptoms include: Stuffy nose (congestion). This may make it hard to breathe through your nose. Runny nose (drainage). Soreness, swelling, and warmth in the sinuses. A cough that may get worse at night. Being unable to smell and taste. Mucus that collects in the throat or the back of the nose (postnasal drip). This may cause a sore throat or bad breath. Being very tired (fatigued). A fever. How is this diagnosed? Your symptoms. Your medical history. A physical exam. Tests to find out if your condition is short-term (acute) or long-term (chronic). Your doctor may: Check your nose for growths (polyps). Check your sinuses using a tool that has a light on one end (endoscope). Check for allergies or germs. Do imaging tests, such as an MRI or CT scan. How is this treated? Treatment for this condition depends on the cause and whether it is short-term or long-term. If caused by a virus, your symptoms should go away on their own within 10 days. You may be given medicines to relieve symptoms. They include: Medicines that shrink swollen tissue in the nose. A spray that treats swelling of the nostrils. Rinses that help get rid of thick mucus in your nose (nasal saline washes). Medicines that treat allergies (antihistamines). Over-the-counter pain relievers. If caused by bacteria, your doctor may wait to see if you will get better  without treatment. You may be given antibiotic medicine if you have: A very bad infection. A weak body defense system. If caused by growths in the nose,  surgery may be needed. Follow these instructions at home: Medicines Take, use, or apply over-the-counter and prescription medicines only as told by your doctor. These may include nasal sprays. If you were prescribed an antibiotic medicine, take it as told by your doctor. Do not stop taking it even if you start to feel better. Hydrate and humidify  Drink enough water to keep your pee (urine) pale yellow. Use a cool mist humidifier to keep the humidity level in your home above 50%. Breathe in steam for 10-15 minutes, 3-4 times a day, or as told by your doctor. You can do this in the bathroom while a hot shower is running. Try not to spend time in cool or dry air. Rest Rest as much as you can. Sleep with your head raised (elevated). Make sure you get enough sleep each night. General instructions  Put a warm, moist washcloth on your face 3-4 times a day, or as often as told by your doctor. Use nasal saline washes as often as told by your doctor. Wash your hands often with soap and water. If you cannot use soap and water, use hand sanitizer. Do not smoke. Avoid being around people who are smoking (secondhand smoke). Keep all follow-up visits. Contact a doctor if: You have a fever. Your symptoms get worse. Your symptoms do not get better within 10 days. Get help right away if: You have a very bad headache. You cannot stop vomiting. You have very bad pain or swelling around your face or eyes. You have trouble seeing. You feel confused. Your neck is stiff. You have trouble breathing. These symptoms may be an emergency. Get help right away. Call 911. Do not wait to see if the symptoms will go away. Do not drive yourself to the hospital. Summary A sinus infection is swelling of your sinuses. Sinuses are hollow spaces in the bones  around your face. This condition is caused by tissues in your nose that become inflamed or swollen. This traps germs. These can lead to infection. If you were prescribed an antibiotic medicine, take it as told by your doctor. Do not stop taking it even if you start to feel better. Keep all follow-up visits. This information is not intended to replace advice given to you by your health care provider. Make sure you discuss any questions you have with your health care provider. Document Revised: 03/13/2021 Document Reviewed: 03/13/2021 Elsevier Patient Education  2023 Elsevier Inc.    Edwina Barth, MD [facility]

## 2021-12-18 NOTE — Assessment & Plan Note (Signed)
Stable. Continue Anoro Ellipta 1 puff daily Presently on 20 mg of prednisone daily. Albuterol as rescue inhaler as needed

## 2021-12-18 NOTE — Assessment & Plan Note (Signed)
May benefit from antibiotic We will start Augmentin 875 mg twice a day for 7 days. Advised to contact the office if no better or worse during the next several days.

## 2021-12-18 NOTE — Assessment & Plan Note (Signed)
May use saline nasal sprays as needed Avoid over-the-counter decongestants

## 2021-12-25 ENCOUNTER — Telehealth: Payer: Self-pay | Admitting: *Deleted

## 2021-12-25 NOTE — Telephone Encounter (Signed)
Called and spoke with patient's daughter, Brian Hopkins (listed on contacts), verified that the patient opted not to have the HST and stopped using the CPAP machine.  She said there would likely be a "discussion" about it tomorrow.  Nothing further needed.

## 2021-12-26 ENCOUNTER — Ambulatory Visit: Payer: Medicare HMO | Admitting: Internal Medicine

## 2021-12-26 ENCOUNTER — Telehealth: Payer: Self-pay | Admitting: *Deleted

## 2021-12-26 ENCOUNTER — Encounter: Payer: Self-pay | Admitting: Internal Medicine

## 2021-12-26 ENCOUNTER — Encounter: Payer: Self-pay | Admitting: Emergency Medicine

## 2021-12-26 VITALS — BP 110/60 | HR 100 | Temp 98.3°F | Ht 68.0 in | Wt 209.6 lb

## 2021-12-26 DIAGNOSIS — G4733 Obstructive sleep apnea (adult) (pediatric): Secondary | ICD-10-CM | POA: Diagnosis not present

## 2021-12-26 DIAGNOSIS — J0100 Acute maxillary sinusitis, unspecified: Secondary | ICD-10-CM

## 2021-12-26 DIAGNOSIS — G4719 Other hypersomnia: Secondary | ICD-10-CM | POA: Diagnosis not present

## 2021-12-26 DIAGNOSIS — J449 Chronic obstructive pulmonary disease, unspecified: Secondary | ICD-10-CM

## 2021-12-26 DIAGNOSIS — H9209 Otalgia, unspecified ear: Secondary | ICD-10-CM

## 2021-12-26 MED ORDER — ANORO ELLIPTA 62.5-25 MCG/ACT IN AEPB: 1.0000 | INHALATION_SPRAY | Freq: Every day | RESPIRATORY_TRACT | 10 refills | Status: DC

## 2021-12-26 MED ORDER — FLUTICASONE PROPIONATE HFA 220 MCG/ACT IN AERO: 2.0000 | INHALATION_SPRAY | Freq: Two times a day (BID) | RESPIRATORY_TRACT | 10 refills | Status: DC

## 2021-12-26 MED ORDER — ALBUTEROL SULFATE HFA 108 (90 BASE) MCG/ACT IN AERS: 2.0000 | INHALATION_SPRAY | RESPIRATORY_TRACT | 12 refills | Status: DC | PRN

## 2021-12-26 MED ORDER — PREDNISONE 20 MG PO TABS: 20.0000 mg | ORAL_TABLET | Freq: Every day | ORAL | 5 refills | Status: DC

## 2021-12-26 NOTE — Progress Notes (Signed)
Name: Brian Hopkins MRN: [id number] DOB: Jul 12, 1931     CONSULTATION DATE: 12/26/2021   CHIEF COMPLAINT:  Follow up OSA Follow up COPD   HISTORY OF PRESENT ILLNESS:  Regarding OSA Patient currently on CPAP therapy at 8 Unable to review CPAP download Feeling much better with therapy  Regarding COPD FEV1 was 90% predicted showed mild obstructive lung disease in 2018 with scooping of the expiratory limbs Patient currently taking Anoro We will add Flovent HFA to his regimen  No exacerbation at this time No evidence of heart failure at this time No evidence or signs of infection at this time No respiratory distress No fevers, chills, nausea, vomiting, diarrhea No evidence of lower extremity edema No evidence hemoptysis   PAST MEDICAL HISTORY :   has a past medical history of COPD (chronic obstructive pulmonary disease) (HCC).  has a past surgical history that includes Cholecystectomy and Appendectomy. Prior to Admission medications   Medication Sig Start Date End Date Taking? Authorizing Provider  acetaminophen (TYLENOL) 650 MG CR tablet Take 1,300 mg by mouth every 8 (eight) hours as needed for pain.    [provider]  albuterol (VENTOLIN HFA) 108 (90 Base) MCG/ACT inhaler Inhale 2 puffs into the lungs every 4 (four) hours as needed for shortness of breath. 11/01/13   [provider]  azelastine (ASTELIN) 0.1 % nasal spray Place 2 sprays into the nose 2 (two) times daily as needed for congestion.    [provider]  benzonatate (TESSALON) 200 MG capsule Take 200 mg by mouth 3 (three) times daily as needed for cough. 02/27/21   [provider]  Calcium-Magnesium-Zinc [phone] MG TABS Take 1 tablet by mouth daily.    [provider]  Cholecalciferol 125 MCG (5000 UT) TABS Take 5,000 Units by mouth daily.    [provider]  famotidine (PEPCID) 20 MG tablet Take 20 mg by mouth daily as needed for indigestion.    [provider]  ipratropium-albuterol (DUONEB) 0.5-2.5 (3) MG/3ML SOLN Inhale 3 mLs into the lungs every 6 (six) hours as needed for shortness of breath or wheezing. 10/17/16   [provider]  methocarbamol (ROBAXIN) 750 MG tablet Take 750 mg by mouth in the morning, at noon, and at bedtime. 02/27/21   [provider]  mometasone-formoterol (DULERA) 200-5 MCG/ACT AERO Inhale 2 puffs into the lungs 2 (two) times daily. 03/13/21   Jacalyn Lefevre, MD  polyethylene glycol powder (GLYCOLAX/MIRALAX) 17 GM/SCOOP powder Take 17 g by mouth daily as needed for constipation. 02/27/21   [provider]  predniSONE (STERAPRED UNI-PAK 21 TAB) 10 MG (21) TBPK tablet Take by mouth daily. Take 6 tabs by mouth daily  for 2 days, then 5 tabs for 2 days, then 4 tabs for 2 days, then 3 tabs for 2 days, 2 tabs for 2 days, then 1 tab by mouth daily for 2 days 03/13/21   Jacalyn Lefevre, MD  umeclidinium-vilanterol [facility] ELLIPTA) 62.5-25 MCG/ACT AEPB Inhale 1 puff into the lungs daily. 11/13/16   [provider]   Allergies  Allergen Reactions   Sulfonamide Derivatives Other (See Comments)    UNK reaction from childhood    FAMILY HISTORY:  family history is not on file. SOCIAL HISTORY:  reports that he has quit smoking. His smoking use included cigarettes. He has never used smokeless tobacco. He reports that he does not currently use alcohol. He reports that he does not use drugs.      Review  of Systems: Gen:  Denies  fever, sweats, chills weight loss  HEENT: Denies blurred vision, double vision, ear pain, eye pain, hearing loss, nose bleeds, sore throat Cardiac:  No dizziness, chest pain or heaviness, chest tightness,edema, No JVD Resp:   No cough, -sputum production, +shortness of breath,-wheezing, -hemoptysis,  Other:  All other systems negative   BP 110/60 (BP Location: Left Arm, Cuff Size: Normal)   Pulse 100   Temp 98.3 F (36.8 C) (Temporal)   Ht 5\' 8"  (1.727 m)    Wt 209 lb 9.6 oz (95.1 kg)   SpO2 93%   BMI 31.87 kg/m   Physical Examination:   General Appearance: No distress  EYES PERRLA, EOM intact.   NECK Supple, No JVD Pulmonary: normal breath sounds, No wheezing.  CardiovascularNormal S1,S2.  No m/r/g.   Abdomen: Benign, Soft, non-tender. ALL OTHER ROS ARE NEGATIVE   ASSESSMENT AND PLAN SYNOPSIS  88 year old pleasant white male seen today for follow-up of sleep apnea and COPD   Regarding OSA  Patient continues to do very well with current CPAP therapy  However I do not have a download and will obtain download at next office visit    Regarding COPD Patient currently on LAMA and LABA however we will add inhaled corticosteroids regimen For therapy will include Anoro and Flovent Patient has been prescribed backup prednisone and antibiotics as requested     MEDICATION ADJUSTMENTS/LABS AND TESTS ORDERED: CONTINUE CPAP AS PRESCRIBED ADD FLOVENT HFA 200 CONTINUE ANORO  Patient  satisfied with Plan of action and management. All questions answered  Follow-up in 1 year  Total time spent 86 minutes   Jailynn Lavalais [id number], M.D.  [facility]  Medical Director [facility] Medical Director [facility] Cardio-Pulmonary Department

## 2021-12-26 NOTE — Telephone Encounter (Signed)
Called [employer] and spoke with Brian Hopkins, I was advised that he has an airsense 10 and received it in 2020.  She could not pull him up in [employer].  She will have him contacted after lunch today to see if he was not loaded in airview or what happed and see if his machine needs to be brought into their office.

## 2021-12-26 NOTE — Patient Instructions (Addendum)
CONTINUE CPAP AS PRESCRIBED ADD FLOVENT HFA 200 CONTINUE ANORO

## 2021-12-26 NOTE — Telephone Encounter (Signed)
Dr. Kasa, please advise. Thanks °

## 2021-12-27 NOTE — Telephone Encounter (Signed)
Recommend ENT referral

## 2022-01-05 ENCOUNTER — Encounter: Payer: Self-pay | Admitting: Emergency Medicine

## 2022-01-09 ENCOUNTER — Encounter: Payer: Self-pay | Admitting: Emergency Medicine

## 2022-01-10 DIAGNOSIS — H353211 Exudative age-related macular degeneration, right eye, with active choroidal neovascularization: Secondary | ICD-10-CM | POA: Diagnosis not present

## 2022-01-10 DIAGNOSIS — H35033 Hypertensive retinopathy, bilateral: Secondary | ICD-10-CM | POA: Diagnosis not present

## 2022-01-10 DIAGNOSIS — H353231 Exudative age-related macular degeneration, bilateral, with active choroidal neovascularization: Secondary | ICD-10-CM | POA: Diagnosis not present

## 2022-01-10 DIAGNOSIS — H43391 Other vitreous opacities, right eye: Secondary | ICD-10-CM | POA: Diagnosis not present

## 2022-01-10 DIAGNOSIS — H43813 Vitreous degeneration, bilateral: Secondary | ICD-10-CM | POA: Diagnosis not present

## 2022-01-10 NOTE — Telephone Encounter (Signed)
Yes

## 2022-01-14 ENCOUNTER — Encounter: Payer: Self-pay | Admitting: Emergency Medicine

## 2022-01-14 ENCOUNTER — Ambulatory Visit (INDEPENDENT_AMBULATORY_CARE_PROVIDER_SITE_OTHER): Payer: Medicare HMO | Admitting: Emergency Medicine

## 2022-01-14 VITALS — BP 118/76 | HR 74 | Temp 97.8°F | Ht 68.0 in | Wt 213.2 lb

## 2022-01-14 DIAGNOSIS — H93293 Other abnormal auditory perceptions, bilateral: Secondary | ICD-10-CM | POA: Diagnosis not present

## 2022-01-14 DIAGNOSIS — R7303 Prediabetes: Secondary | ICD-10-CM | POA: Diagnosis not present

## 2022-01-14 DIAGNOSIS — Z23 Encounter for immunization: Secondary | ICD-10-CM | POA: Diagnosis not present

## 2022-01-14 DIAGNOSIS — E785 Hyperlipidemia, unspecified: Secondary | ICD-10-CM

## 2022-01-14 DIAGNOSIS — J449 Chronic obstructive pulmonary disease, unspecified: Secondary | ICD-10-CM

## 2022-01-14 NOTE — Assessment & Plan Note (Signed)
Chronic and affecting quality of life. Scheduled to see ENT doctor tomorrow.

## 2022-01-14 NOTE — Progress Notes (Signed)
Brian Hopkins 87 y.o.   Chief Complaint  Patient presents with   Follow-up    29mnth f/u appt, amoxicillin for the ear did not help. Pt has an appt with [facility] tomorrow     HISTORY OF PRESENT ILLNESS: This is a 88 y.o. male here for 75-month follow-up.  Complaining of persistent ear problems. Skin healed for ENT evaluation tomorrow. Otherwise doing well. No other complaints or medical concerns today.  HPI   Prior to Admission medications   Medication Sig Start Date End Date Taking? Authorizing Provider  acetaminophen (TYLENOL) 650 MG CR tablet Take 1,300 mg by mouth every 8 (eight) hours as needed for pain.   Yes [provider]  albuterol (VENTOLIN HFA) 108 (90 Base) MCG/ACT inhaler Inhale 2 puffs into the lungs every 4 (four) hours as needed for shortness of breath. 12/26/21  Yes Kasa, [name], MD  azelastine (ASTELIN) 0.1 % nasal spray Place 2 sprays into both nostrils 2 (two) times daily. 03/29/21  Yes Flora Lipps, MD  azithromycin (ZITHROMAX) 250 MG tablet 2 tablets on day 1 followed by 1 tablet daily 11/05/21  Yes Flora Lipps, MD  Cholecalciferol 125 MCG (5000 UT) TABS Take 5,000 Units by mouth daily.   Yes [provider]  clonazePAM (KLONOPIN) 0.5 MG tablet Take 1 tablet (0.5 mg total) by mouth 2 (two) times daily as needed for anxiety. 11/14/21  Yes Biagio Borg, MD  fluticasone [facility]) 50 MCG/ACT nasal spray Place 1 spray into both nostrils daily. 03/29/21 03/29/22 Yes Kasa, [name], MD  fluticasone (FLOVENT HFA) 220 MCG/ACT inhaler Inhale 2 puffs into the lungs 2 (two) times daily. 12/26/21 12/26/22 Yes Kasa, [name], MD  hydrOXYzine (ATARAX) 25 MG tablet TAKE 1 TABLET BY MOUTH EVERY 8 HOURS AS NEEDED FOR ANXIETY. 09/16/21  Yes Laney Louderback, Ines Bloomer, MD  ipratropium-albuterol (DUONEB) 0.5-2.5 (3) MG/3ML SOLN Inhale 3 mLs into the lungs every 6 (six) hours as needed. 03/29/21  Yes Flora Lipps, MD  Multiple Vitamins-Minerals (CENTRUM ADULTS PO) Take 2 capsules by mouth  every morning.   Yes [provider]  polyethylene glycol powder (GLYCOLAX/MIRALAX) 17 GM/SCOOP powder Take 17 g by mouth daily as needed for constipation. 02/27/21  Yes [provider]  predniSONE (DELTASONE) 20 MG tablet Take 1 tablet (20 mg total) by mouth daily with breakfast. 10 days 11/05/21  Yes Flora Lipps, MD  predniSONE (DELTASONE) 20 MG tablet Take 1 tablet (20 mg total) by mouth daily with breakfast. 10 days 12/26/21  Yes Kasa, [name], MD  umeclidinium-vilanterol (ANORO ELLIPTA) 62.5-25 MCG/ACT AEPB Inhale 1 puff into the lungs daily. 12/26/21  Yes Flora Lipps, MD    Allergies  Allergen Reactions   Sulfonamide Derivatives Other (See Comments)    UNK reaction from childhood    Patient Active Problem List   Diagnosis Date Noted   Acute non-recurrent maxillary sinusitis 12/18/2021   History of elevated PSA 07/11/2021   Prediabetes 07/11/2021   History of COPD 07/11/2021   Dyslipidemia 12/16/2006    Past Medical History:  Diagnosis Date   COPD (chronic obstructive pulmonary disease) ([place])     Past Surgical History:  Procedure Laterality Date   APPENDECTOMY     CHOLECYSTECTOMY      Social History   Socioeconomic History   Marital status: Widowed    Spouse name: Not on file   Number of children: Not on file   Years of education: Not on file   Highest education level: Not on file  Occupational  History   Not on file  Tobacco Use   Smoking status: Former    Years: 2.00    Types: Cigarettes    Quit date: 1954    Years since quitting: 69.7   Smokeless tobacco: Never  Substance and Sexual Activity   Alcohol use: Not Currently   Drug use: Never   Sexual activity: Not on file  Other Topics Concern   Not on file  Social History Narrative   Not on file   Social Determinants of Health   Financial Resource Strain: Not on file  Food Insecurity: Not on file  Transportation Needs: Not on file  Physical Activity: Not on file  Stress: Not on file   Social Connections: Not on file  Intimate Partner Violence: Not on file    No family history on file.   Review of Systems  Constitutional: Negative.  Negative for chills and fever.  HENT:  Positive for hearing loss. Negative for congestion and sore throat.   Respiratory: Negative.  Negative for cough and shortness of breath.   Cardiovascular: Negative.  Negative for chest pain and palpitations.  Gastrointestinal:  Negative for abdominal pain, diarrhea, nausea and vomiting.  Genitourinary: Negative.   Skin: Negative.  Negative for rash.  Neurological:  Negative for dizziness and headaches.  All other systems reviewed and are negative.   Today's Vitals   01/14/22 1019  BP: 118/76  Pulse: 74  Temp: 97.8 F (36.6 C)  TempSrc: Oral  SpO2: 93%  Weight: 213 lb 4 oz (96.7 kg)  Height: 5\' 8"  (1.727 m)   Body mass index is 32.42 kg/m. Wt Readings from Last 3 Encounters:  01/14/22 213 lb 4 oz (96.7 kg)  12/26/21 209 lb 9.6 oz (95.1 kg)  12/18/21 211 lb 4 oz (95.8 kg)    Physical Exam Vitals reviewed.  Constitutional:      Appearance: Normal appearance.  HENT:     Head: Normocephalic.     Right Ear: Tympanic membrane, ear canal and external ear normal.     Left Ear: Tympanic membrane, ear canal and external ear normal.     Mouth/Throat:     Mouth: Mucous membranes are moist.     Pharynx: Oropharynx is clear.  Eyes:     Extraocular Movements: Extraocular movements intact.     Conjunctiva/sclera: Conjunctivae normal.     Pupils: Pupils are equal, round, and reactive to light.  Cardiovascular:     Rate and Rhythm: Normal rate and regular rhythm.     Pulses: Normal pulses.     Heart sounds: Normal heart sounds.  Pulmonary:     Effort: Pulmonary effort is normal.     Breath sounds: Normal breath sounds.  Abdominal:     Palpations: Abdomen is soft.     Tenderness: There is no abdominal tenderness.  Musculoskeletal:     Cervical back: No tenderness.  Lymphadenopathy:      Cervical: No cervical adenopathy.  Skin:    General: Skin is warm and dry.     Capillary Refill: Capillary refill takes less than 2 seconds.  Neurological:     General: No focal deficit present.     Mental Status: He is alert and oriented to person, place, and time.  Psychiatric:        Mood and Affect: Mood normal.        Behavior: Behavior normal.      ASSESSMENT & PLAN: A total of 44 minutes was spent with the patient and counseling/coordination  of care regarding preparing for this visit, review of most recent office visit notes, review of multiple chronic medical problems and their management, review of all medications, review of most recent blood work results, education on nutrition, prognosis, documentation, need for follow-up.  Problem List Items Addressed This Visit       Respiratory   Chronic obstructive pulmonary disease (HCC) - Primary    Stable.  No concerns. Continue Anoro Ellipta 1 puff daily.        Other   Dyslipidemia    Stable.  Diet and nutrition discussed. Lab Results  Component Value Date   CHOL 244 (H) 07/11/2021   HDL 59.20 07/11/2021   LDLDIRECT 158.0 07/11/2021   TRIG 213.0 (H) 07/11/2021   CHOLHDL 4 07/11/2021  Not on any medication at present time. Does not want to take cholesterol medications.       Prediabetes    Diet and nutrition discussed. Lab Results  Component Value Date   HGBA1C 5.9 07/11/2021         Auditory complaints of both ears    Chronic and affecting quality of life. Scheduled to see ENT doctor tomorrow.      Other Visit Diagnoses     Need for vaccination       Relevant Orders   Flu Vaccine QUAD High Dose(Fluad) (Completed)      Patient Instructions  Health Maintenance After Age 15 After age 24, you are at a higher risk for certain long-term diseases and infections as well as injuries from falls. Falls are a major cause of broken bones and head injuries in people who are older than age 62. Getting  regular preventive care can help to keep you healthy and well. Preventive care includes getting regular testing and making lifestyle changes as recommended by your health care provider. Talk with your health care provider about: Which screenings and tests you should have. A screening is a test that checks for a disease when you have no symptoms. A diet and exercise plan that is right for you. What should I know about screenings and tests to prevent falls? Screening and testing are the best ways to find a health problem early. Early diagnosis and treatment give you the best chance of managing medical conditions that are common after age 59. Certain conditions and lifestyle choices may make you more likely to have a fall. Your health care provider may recommend: Regular vision checks. Poor vision and conditions such as cataracts can make you more likely to have a fall. If you wear glasses, make sure to get your prescription updated if your vision changes. Medicine review. Work with your health care provider to regularly review all of the medicines you are taking, including over-the-counter medicines. Ask your health care provider about any side effects that may make you more likely to have a fall. Tell your health care provider if any medicines that you take make you feel dizzy or sleepy. Strength and balance checks. Your health care provider may recommend certain tests to check your strength and balance while standing, walking, or changing positions. Foot health exam. Foot pain and numbness, as well as not wearing proper footwear, can make you more likely to have a fall. Screenings, including: Osteoporosis screening. Osteoporosis is a condition that causes the bones to get weaker and break more easily. Blood pressure screening. Blood pressure changes and medicines to control blood pressure can make you feel dizzy. Depression screening. You may be more likely to have a fall  if you have a fear of falling,  feel depressed, or feel unable to do activities that you used to do. Alcohol use screening. Using too much alcohol can affect your balance and may make you more likely to have a fall. Follow these instructions at home: Lifestyle Do not drink alcohol if: Your health care provider tells you not to drink. If you drink alcohol: Limit how much you have to: 0-1 drink a day for women. 0-2 drinks a day for men. Know how much alcohol is in your drink. In the [place] one drink equals one 12 oz bottle of beer (355 mL), one 5 oz glass of wine (148 mL), or one 1 oz glass of hard liquor (44 mL). Do not use any products that contain nicotine or tobacco. These products include cigarettes, chewing tobacco, and vaping devices, such as e-cigarettes. If you need help quitting, ask your health care provider. Activity  Follow a regular exercise program to stay fit. This will help you maintain your balance. Ask your health care provider what types of exercise are appropriate for you. If you need a cane or walker, use it as recommended by your health care provider. Wear supportive shoes that have nonskid soles. Safety  Remove any tripping hazards, such as rugs, cords, and clutter. Install safety equipment such as grab bars in bathrooms and safety rails on stairs. Keep rooms and walkways well-lit. General instructions Talk with your health care provider about your risks for falling. Tell your health care provider if: You fall. Be sure to tell your health care provider about all falls, even ones that seem minor. You feel dizzy, tiredness (fatigue), or off-balance. Take over-the-counter and prescription medicines only as told by your health care provider. These include supplements. Eat a healthy diet and maintain a healthy weight. A healthy diet includes low-fat dairy products, low-fat (lean) meats, and fiber from whole grains, beans, and lots of fruits and vegetables. Stay current with your vaccines. Schedule  regular health, dental, and eye exams. Summary Having a healthy lifestyle and getting preventive care can help to protect your health and wellness after age 30. Screening and testing are the best way to find a health problem early and help you avoid having a fall. Early diagnosis and treatment give you the best chance for managing medical conditions that are more common for people who are older than age 40. Falls are a major cause of broken bones and head injuries in people who are older than age 6. Take precautions to prevent a fall at home. Work with your health care provider to learn what changes you can make to improve your health and wellness and to prevent falls. This information is not intended to replace advice given to you by your health care provider. Make sure you discuss any questions you have with your health care provider. Document Revised: 08/28/2020 Document Reviewed: 08/28/2020 Elsevier Patient Education  2023 Elsevier Inc.     Edwina Barth, MD [facility]

## 2022-01-14 NOTE — Assessment & Plan Note (Signed)
Stable.  No concerns. Continue Anoro Ellipta 1 puff daily.

## 2022-01-14 NOTE — Patient Instructions (Signed)
Health Maintenance After Age 86 After age 86, you are at a higher risk for certain long-term diseases and infections as well as injuries from falls. Falls are a major cause of broken bones and head injuries in people who are older than age 86. Getting regular preventive care can help to keep you healthy and well. Preventive care includes getting regular testing and making lifestyle changes as recommended by your health care provider. Talk with your health care provider about: Which screenings and tests you should have. A screening is a test that checks for a disease when you have no symptoms. A diet and exercise plan that is right for you. What should I know about screenings and tests to prevent falls? Screening and testing are the best ways to find a health problem early. Early diagnosis and treatment give you the best chance of managing medical conditions that are common after age 86. Certain conditions and lifestyle choices may make you more likely to have a fall. Your health care provider may recommend: Regular vision checks. Poor vision and conditions such as cataracts can make you more likely to have a fall. If you wear glasses, make sure to get your prescription updated if your vision changes. Medicine review. Work with your health care provider to regularly review all of the medicines you are taking, including over-the-counter medicines. Ask your health care provider about any side effects that may make you more likely to have a fall. Tell your health care provider if any medicines that you take make you feel dizzy or sleepy. Strength and balance checks. Your health care provider may recommend certain tests to check your strength and balance while standing, walking, or changing positions. Foot health exam. Foot pain and numbness, as well as not wearing proper footwear, can make you more likely to have a fall. Screenings, including: Osteoporosis screening. Osteoporosis is a condition that causes  the bones to get weaker and break more easily. Blood pressure screening. Blood pressure changes and medicines to control blood pressure can make you feel dizzy. Depression screening. You may be more likely to have a fall if you have a fear of falling, feel depressed, or feel unable to do activities that you used to do. Alcohol use screening. Using too much alcohol can affect your balance and may make you more likely to have a fall. Follow these instructions at home: Lifestyle Do not drink alcohol if: Your health care provider tells you not to drink. If you drink alcohol: Limit how much you have to: 0-1 drink a day for women. 0-2 drinks a day for men. Know how much alcohol is in your drink. In the [place] one drink equals one 12 oz bottle of beer (355 mL), one 5 oz glass of wine (148 mL), or one 1 oz glass of hard liquor (44 mL). Do not use any products that contain nicotine or tobacco. These products include cigarettes, chewing tobacco, and vaping devices, such as e-cigarettes. If you need help quitting, ask your health care provider. Activity  Follow a regular exercise program to stay fit. This will help you maintain your balance. Ask your health care provider what types of exercise are appropriate for you. If you need a cane or walker, use it as recommended by your health care provider. Wear supportive shoes that have nonskid soles. Safety  Remove any tripping hazards, such as rugs, cords, and clutter. Install safety equipment such as grab bars in bathrooms and safety rails on stairs. Keep rooms and walkways   well-lit. General instructions Talk with your health care provider about your risks for falling. Tell your health care provider if: You fall. Be sure to tell your health care provider about all falls, even ones that seem minor. You feel dizzy, tiredness (fatigue), or off-balance. Take over-the-counter and prescription medicines only as told by your health care provider. These include  supplements. Eat a healthy diet and maintain a healthy weight. A healthy diet includes low-fat dairy products, low-fat (lean) meats, and fiber from whole grains, beans, and lots of fruits and vegetables. Stay current with your vaccines. Schedule regular health, dental, and eye exams. Summary Having a healthy lifestyle and getting preventive care can help to protect your health and wellness after age 86. Screening and testing are the best way to find a health problem early and help you avoid having a fall. Early diagnosis and treatment give you the best chance for managing medical conditions that are more common for people who are older than age 86. Falls are a major cause of broken bones and head injuries in people who are older than age 86. Take precautions to prevent a fall at home. Work with your health care provider to learn what changes you can make to improve your health and wellness and to prevent falls. This information is not intended to replace advice given to you by your health care provider. Make sure you discuss any questions you have with your health care provider. Document Revised: 08/28/2020 Document Reviewed: 08/28/2020 Elsevier Patient Education  2023 Elsevier Inc.  

## 2022-01-14 NOTE — Assessment & Plan Note (Signed)
Stable.  Diet and nutrition discussed. Lab Results  Component Value Date   CHOL 244 (H) 07/11/2021   HDL 59.20 07/11/2021   LDLDIRECT 158.0 07/11/2021   TRIG 213.0 (H) 07/11/2021   CHOLHDL 4 07/11/2021  Not on any medication at present time. Does not want to take cholesterol medications.

## 2022-01-14 NOTE — Assessment & Plan Note (Signed)
Diet and nutrition discussed. Lab Results  Component Value Date   HGBA1C 5.9 07/11/2021

## 2022-01-15 ENCOUNTER — Other Ambulatory Visit: Payer: Self-pay | Admitting: Internal Medicine

## 2022-01-15 DIAGNOSIS — H903 Sensorineural hearing loss, bilateral: Secondary | ICD-10-CM | POA: Diagnosis not present

## 2022-01-15 DIAGNOSIS — H9120 Sudden idiopathic hearing loss, unspecified ear: Secondary | ICD-10-CM | POA: Diagnosis not present

## 2022-01-15 NOTE — Telephone Encounter (Signed)
Ok to PCP please 

## 2022-02-20 DIAGNOSIS — H43813 Vitreous degeneration, bilateral: Secondary | ICD-10-CM | POA: Diagnosis not present

## 2022-02-20 DIAGNOSIS — H353223 Exudative age-related macular degeneration, left eye, with inactive scar: Secondary | ICD-10-CM | POA: Diagnosis not present

## 2022-02-20 DIAGNOSIS — H353211 Exudative age-related macular degeneration, right eye, with active choroidal neovascularization: Secondary | ICD-10-CM | POA: Diagnosis not present

## 2022-02-20 DIAGNOSIS — H353231 Exudative age-related macular degeneration, bilateral, with active choroidal neovascularization: Secondary | ICD-10-CM | POA: Diagnosis not present

## 2022-02-20 DIAGNOSIS — H43391 Other vitreous opacities, right eye: Secondary | ICD-10-CM | POA: Diagnosis not present

## 2022-02-28 ENCOUNTER — Encounter: Payer: Self-pay | Admitting: Internal Medicine

## 2022-02-28 ENCOUNTER — Encounter: Payer: Self-pay | Admitting: Emergency Medicine

## 2022-03-26 ENCOUNTER — Telehealth: Payer: Self-pay | Admitting: Emergency Medicine

## 2022-03-26 NOTE — Telephone Encounter (Signed)
Left message for patient to call back to schedule Medicare Annual Wellness Visit  ? ?No hx of AWV eligible as of 04/22/09 ? ?Please schedule at anytime with [facility] Advisor if patient calls the office back.   ? ? ?Any questions, please call me at [phone]  ?

## 2022-04-04 DIAGNOSIS — H353211 Exudative age-related macular degeneration, right eye, with active choroidal neovascularization: Secondary | ICD-10-CM | POA: Diagnosis not present

## 2022-04-14 ENCOUNTER — Other Ambulatory Visit: Payer: Self-pay | Admitting: Emergency Medicine

## 2022-04-17 ENCOUNTER — Encounter: Payer: Self-pay | Admitting: Emergency Medicine

## 2022-05-06 DIAGNOSIS — R972 Elevated prostate specific antigen [PSA]: Secondary | ICD-10-CM | POA: Diagnosis not present

## 2022-05-13 ENCOUNTER — Other Ambulatory Visit: Payer: Self-pay | Admitting: Internal Medicine

## 2022-05-13 DIAGNOSIS — J449 Chronic obstructive pulmonary disease, unspecified: Secondary | ICD-10-CM

## 2022-05-16 DIAGNOSIS — H43393 Other vitreous opacities, bilateral: Secondary | ICD-10-CM | POA: Diagnosis not present

## 2022-05-16 DIAGNOSIS — H353211 Exudative age-related macular degeneration, right eye, with active choroidal neovascularization: Secondary | ICD-10-CM | POA: Diagnosis not present

## 2022-05-16 DIAGNOSIS — H43813 Vitreous degeneration, bilateral: Secondary | ICD-10-CM | POA: Diagnosis not present

## 2022-05-16 DIAGNOSIS — H35033 Hypertensive retinopathy, bilateral: Secondary | ICD-10-CM | POA: Diagnosis not present

## 2022-05-16 DIAGNOSIS — H353223 Exudative age-related macular degeneration, left eye, with inactive scar: Secondary | ICD-10-CM | POA: Diagnosis not present

## 2022-05-23 ENCOUNTER — Other Ambulatory Visit: Payer: Self-pay | Admitting: Emergency Medicine

## 2022-05-27 ENCOUNTER — Encounter: Payer: Self-pay | Admitting: Internal Medicine

## 2022-05-27 DIAGNOSIS — J449 Chronic obstructive pulmonary disease, unspecified: Secondary | ICD-10-CM

## 2022-05-27 MED ORDER — ANORO ELLIPTA 62.5-25 MCG/ACT IN AEPB: 1.0000 | INHALATION_SPRAY | Freq: Every day | RESPIRATORY_TRACT | 1 refills | Status: DC

## 2022-06-18 ENCOUNTER — Other Ambulatory Visit: Payer: Self-pay | Admitting: Emergency Medicine

## 2022-06-26 DIAGNOSIS — L538 Other specified erythematous conditions: Secondary | ICD-10-CM | POA: Diagnosis not present

## 2022-06-26 DIAGNOSIS — L821 Other seborrheic keratosis: Secondary | ICD-10-CM | POA: Diagnosis not present

## 2022-06-26 DIAGNOSIS — L820 Inflamed seborrheic keratosis: Secondary | ICD-10-CM | POA: Diagnosis not present

## 2022-06-26 DIAGNOSIS — D0439 Carcinoma in situ of skin of other parts of face: Secondary | ICD-10-CM | POA: Diagnosis not present

## 2022-06-26 DIAGNOSIS — Z08 Encounter for follow-up examination after completed treatment for malignant neoplasm: Secondary | ICD-10-CM | POA: Diagnosis not present

## 2022-06-26 DIAGNOSIS — L570 Actinic keratosis: Secondary | ICD-10-CM | POA: Diagnosis not present

## 2022-07-04 DIAGNOSIS — H35033 Hypertensive retinopathy, bilateral: Secondary | ICD-10-CM | POA: Diagnosis not present

## 2022-07-04 DIAGNOSIS — H353223 Exudative age-related macular degeneration, left eye, with inactive scar: Secondary | ICD-10-CM | POA: Diagnosis not present

## 2022-07-04 DIAGNOSIS — H43393 Other vitreous opacities, bilateral: Secondary | ICD-10-CM | POA: Diagnosis not present

## 2022-07-04 DIAGNOSIS — H43813 Vitreous degeneration, bilateral: Secondary | ICD-10-CM | POA: Diagnosis not present

## 2022-07-04 DIAGNOSIS — H353211 Exudative age-related macular degeneration, right eye, with active choroidal neovascularization: Secondary | ICD-10-CM | POA: Diagnosis not present

## 2022-07-08 ENCOUNTER — Telehealth: Payer: Self-pay

## 2022-07-08 NOTE — Telephone Encounter (Signed)
Spoke to patient's daughter, Cindy(DPR) she stated that patient is not wearing cpap.

## 2022-07-09 ENCOUNTER — Ambulatory Visit: Payer: Medicare HMO | Admitting: Internal Medicine

## 2022-07-09 ENCOUNTER — Encounter: Payer: Self-pay | Admitting: Internal Medicine

## 2022-07-09 VITALS — BP 130/62 | HR 100 | Temp 97.8°F | Ht 69.0 in | Wt 210.4 lb

## 2022-07-09 DIAGNOSIS — G4733 Obstructive sleep apnea (adult) (pediatric): Secondary | ICD-10-CM

## 2022-07-09 DIAGNOSIS — J449 Chronic obstructive pulmonary disease, unspecified: Secondary | ICD-10-CM

## 2022-07-09 MED ORDER — IPRATROPIUM-ALBUTEROL 0.5-2.5 (3) MG/3ML IN SOLN: 3.0000 mL | Freq: Four times a day (QID) | RESPIRATORY_TRACT | 12 refills | Status: DC | PRN

## 2022-07-09 NOTE — Progress Notes (Signed)
Name: Brian Hopkins MRN: [id number] DOB: [birth year]     CONSULTATION DATE: 07/09/2022   CHIEF COMPLAINT:  Follow-up COPD    HISTORY OF PRESENT ILLNESS: Regarding OSA Patient has STOPPED USING CPAP Patient had problem with the mask and too much pressure and decided not to use CPAP anymore I have explained that patient is at high risk for stroke heart problems  COPD Very mild disease in 2018 FEV1 was 90% predicted Scooping of the expiratory limbs Patient currently on Anoro and Flovent  + exacerbation at this time No evidence of heart failure at this time No evidence or signs of infection at this time No respiratory distress No fevers, chills, nausea, vomiting, diarrhea No evidence of lower extremity edema No evidence hemoptysis  Patient to start prednisone and Z-Pak Zyrtec as needed    PAST MEDICAL HISTORY :   has a past medical history of COPD (chronic obstructive pulmonary disease) ([place]).  has a past surgical history that includes Cholecystectomy and Appendectomy. Prior to Admission medications   Medication Sig Start Date End Date Taking? Authorizing Provider  acetaminophen (TYLENOL) 650 MG CR tablet Take 1,300 mg by mouth every 8 (eight) hours as needed for pain.    [provider]  albuterol (VENTOLIN HFA) 108 (90 Base) MCG/ACT inhaler Inhale 2 puffs into the lungs every 4 (four) hours as needed for shortness of breath. 11/01/13   [provider]  azelastine (ASTELIN) 0.1 % nasal spray Place 2 sprays into the nose 2 (two) times daily as needed for congestion.    [provider]  benzonatate (TESSALON) 200 MG capsule Take 200 mg by mouth 3 (three) times daily as needed for cough. 02/27/21   [provider]  Calcium-Magnesium-Zinc [phone] MG TABS Take 1 tablet by mouth daily.    [provider]  Cholecalciferol 125 MCG (5000 UT) TABS Take 5,000 Units by mouth daily.    [provider]  famotidine (PEPCID) 20 MG  tablet Take 20 mg by mouth daily as needed for indigestion.    [provider]  ipratropium-albuterol (DUONEB) 0.5-2.5 (3) MG/3ML SOLN Inhale 3 mLs into the lungs every 6 (six) hours as needed for shortness of breath or wheezing. 10/17/16   [provider]  methocarbamol (ROBAXIN) 750 MG tablet Take 750 mg by mouth in the morning, at noon, and at bedtime. 02/27/21   [provider]  mometasone-formoterol (DULERA) 200-5 MCG/ACT AERO Inhale 2 puffs into the lungs 2 (two) times daily. 03/13/21   Brian Pence, MD  polyethylene glycol powder (GLYCOLAX/MIRALAX) 17 GM/SCOOP powder Take 17 g by mouth daily as needed for constipation. 02/27/21   [provider]  predniSONE (STERAPRED UNI-PAK 21 TAB) 10 MG (21) TBPK tablet Take by mouth daily. Take 6 tabs by mouth daily  for 2 days, then 5 tabs for 2 days, then 4 tabs for 2 days, then 3 tabs for 2 days, 2 tabs for 2 days, then 1 tab by mouth daily for 2 days 03/13/21   Brian Pence, MD  umeclidinium-vilanterol [facility] ELLIPTA) 62.5-25 MCG/ACT AEPB Inhale 1 puff into the lungs daily. 11/13/16   [provider]   Allergies  Allergen Reactions   Sulfonamide Derivatives Other (See Comments)    UNK reaction from childhood    FAMILY HISTORY:  family history is not on file. SOCIAL HISTORY:  reports that he quit smoking about 70 years ago. His smoking use included cigarettes. He has never used smokeless tobacco. He reports that he does  not currently use alcohol. He reports that he does not use drugs.      Review of Systems: Gen:  Denies  fever, sweats, chills weight loss  HEENT: Denies blurred vision, double vision, ear pain, eye pain, hearing loss, nose bleeds, sore throat Cardiac:  No dizziness, chest pain or heaviness, chest tightness,edema, No JVD Resp:   + cough, +sputum production, +shortness of breath,+wheezing, -hemoptysis,  Other:  All other systems negative   Physical Examination:   General  Appearance: No distress  EYES PERRLA, EOM intact.   NECK Supple, No JVD Pulmonary: normal breath sounds, + wheezing.  CardiovascularNormal S1,S2.  No m/r/g.   Abdomen: Benign, Soft, non-tender. Neurology UE/LE 5/5 strength, no focal deficits Ext pulses intact, cap refill intact ALL OTHER ROS ARE NEGATIVE   ASSESSMENT AND PLAN SYNOPSIS  89 year old pleasant white male seen today for follow-up assessment for sleep apnea and COPD  Regarding sleep apnea Patient has stopped using his CPAP machine Risks of noncompliant explained to patient with high risk of stroke, arrhythmias Patient seems to be happy not using CPAP anymore  COPD Currently on LAMA and LABA and inhaled corticosteroid regimen Patient on Anoro and Flovent Patient has been prescribed backup prednisone and antibiotics as requested Exacerbation at this time likely due to seasonal allergies Continue prednisone and Z-Pak as prescribed Continue Zyrtec as tolerated     MEDICATION ADJUSTMENTS/LABS AND TESTS ORDERED: CONTINUE  FLOVENT HFA 200 CONTINUE ANORO Continue prednisone therapy Please start Z-Pak Continue Zyrtec for allergies Use albuterol nebulizer as needed  Patient  satisfied with Plan of action and management. All questions answered  Follow-up in 6 months  Total time spent 25 minutes   Brian Hopkins Brian Hopkins, M.D.  [facility]  Medical Director [place] Director [facility] Cardio-Pulmonary Department

## 2022-07-09 NOTE — Patient Instructions (Addendum)
  CONTINUE  FLOVENT HFA 200 CONTINUE ANORO  Continue prednisone therapy Please start Z-Pak  Continue Zyrtec for allergies  Use albuterol nebulizer as needed

## 2022-07-09 NOTE — Telephone Encounter (Signed)
Ok to send in [place]?

## 2022-07-10 MED ORDER — AZITHROMYCIN 250 MG PO TABS: ORAL_TABLET | ORAL | 0 refills | Status: DC

## 2022-07-15 ENCOUNTER — Ambulatory Visit: Payer: Medicare HMO | Admitting: Emergency Medicine

## 2022-07-15 DIAGNOSIS — E785 Hyperlipidemia, unspecified: Secondary | ICD-10-CM

## 2022-07-22 ENCOUNTER — Ambulatory Visit (INDEPENDENT_AMBULATORY_CARE_PROVIDER_SITE_OTHER): Payer: Medicare HMO | Admitting: Emergency Medicine

## 2022-07-22 ENCOUNTER — Encounter: Payer: Self-pay | Admitting: Emergency Medicine

## 2022-07-22 VITALS — BP 130/72 | HR 76 | Temp 98.1°F | Ht 69.0 in | Wt 213.1 lb

## 2022-07-22 DIAGNOSIS — Z87898 Personal history of other specified conditions: Secondary | ICD-10-CM | POA: Diagnosis not present

## 2022-07-22 DIAGNOSIS — R7303 Prediabetes: Secondary | ICD-10-CM

## 2022-07-22 DIAGNOSIS — C61 Malignant neoplasm of prostate: Secondary | ICD-10-CM

## 2022-07-22 DIAGNOSIS — J449 Chronic obstructive pulmonary disease, unspecified: Secondary | ICD-10-CM | POA: Diagnosis not present

## 2022-07-22 HISTORY — DX: Malignant neoplasm of prostate: C61

## 2022-07-22 NOTE — Assessment & Plan Note (Signed)
Recovered well from recent flareup Took Zithromax and prednisone Also increased frequency of nebulizers Much improved today.

## 2022-07-22 NOTE — Patient Instructions (Signed)
Health Maintenance After Age 87 After age 87, you are at a higher risk for certain long-term diseases and infections as well as injuries from falls. Falls are a major cause of broken bones and head injuries in people who are older than age 87. Getting regular preventive care can help to keep you healthy and well. Preventive care includes getting regular testing and making lifestyle changes as recommended by your health care provider. Talk with your health care provider about: Which screenings and tests you should have. A screening is a test that checks for a disease when you have no symptoms. A diet and exercise plan that is right for you. What should I know about screenings and tests to prevent falls? Screening and testing are the best ways to find a health problem early. Early diagnosis and treatment give you the best chance of managing medical conditions that are common after age 87. Certain conditions and lifestyle choices may make you more likely to have a fall. Your health care provider may recommend: Regular vision checks. Poor vision and conditions such as cataracts can make you more likely to have a fall. If you wear glasses, make sure to get your prescription updated if your vision changes. Medicine review. Work with your health care provider to regularly review all of the medicines you are taking, including over-the-counter medicines. Ask your health care provider about any side effects that may make you more likely to have a fall. Tell your health care provider if any medicines that you take make you feel dizzy or sleepy. Strength and balance checks. Your health care provider may recommend certain tests to check your strength and balance while standing, walking, or changing positions. Foot health exam. Foot pain and numbness, as well as not wearing proper footwear, can make you more likely to have a fall. Screenings, including: Osteoporosis screening. Osteoporosis is a condition that causes  the bones to get weaker and break more easily. Blood pressure screening. Blood pressure changes and medicines to control blood pressure can make you feel dizzy. Depression screening. You may be more likely to have a fall if you have a fear of falling, feel depressed, or feel unable to do activities that you used to do. Alcohol use screening. Using too much alcohol can affect your balance and may make you more likely to have a fall. Follow these instructions at home: Lifestyle Do not drink alcohol if: Your health care provider tells you not to drink. If you drink alcohol: Limit how much you have to: 0-1 drink a day for women. 0-2 drinks a day for men. Know how much alcohol is in your drink. In the [place] one drink equals one 12 oz bottle of beer (355 mL), one 5 oz glass of wine (148 mL), or one 1 oz glass of hard liquor (44 mL). Do not use any products that contain nicotine or tobacco. These products include cigarettes, chewing tobacco, and vaping devices, such as e-cigarettes. If you need help quitting, ask your health care provider. Activity  Follow a regular exercise program to stay fit. This will help you maintain your balance. Ask your health care provider what types of exercise are appropriate for you. If you need a cane or walker, use it as recommended by your health care provider. Wear supportive shoes that have nonskid soles. Safety  Remove any tripping hazards, such as rugs, cords, and clutter. Install safety equipment such as grab bars in bathrooms and safety rails on stairs. Keep rooms and walkways   well-lit. General instructions Talk with your health care provider about your risks for falling. Tell your health care provider if: You fall. Be sure to tell your health care provider about all falls, even ones that seem minor. You feel dizzy, tiredness (fatigue), or off-balance. Take over-the-counter and prescription medicines only as told by your health care provider. These include  supplements. Eat a healthy diet and maintain a healthy weight. A healthy diet includes low-fat dairy products, low-fat (lean) meats, and fiber from whole grains, beans, and lots of fruits and vegetables. Stay current with your vaccines. Schedule regular health, dental, and eye exams. Summary Having a healthy lifestyle and getting preventive care can help to protect your health and wellness after age 87. Screening and testing are the best way to find a health problem early and help you avoid having a fall. Early diagnosis and treatment give you the best chance for managing medical conditions that are more common for people who are older than age 87. Falls are a major cause of broken bones and head injuries in people who are older than age 87. Take precautions to prevent a fall at home. Work with your health care provider to learn what changes you can make to improve your health and wellness and to prevent falls. This information is not intended to replace advice given to you by your health care provider. Make sure you discuss any questions you have with your health care provider. Document Revised: 08/28/2020 Document Reviewed: 08/28/2020 Elsevier Patient Education  2023 Elsevier Inc.  

## 2022-07-22 NOTE — Assessment & Plan Note (Signed)
Eating well and staying physically active as much as possible

## 2022-07-22 NOTE — Progress Notes (Signed)
Brian Hopkins 89 y.o.   Chief Complaint  Patient presents with   Medical Management of Chronic Issues    49mth f/u appt, patient is not sleeping well.     HISTORY OF PRESENT ILLNESS: This is a 89 y.o. male A1A here for follow-up of chronic medical problems Accompanied by wife Overall doing well. Has occasional nocturia interfering with his sleep cycle.  Has follow-up appointment with urologist in a couple weeks for prostate follow-up.  HPI   Prior to Admission medications   Medication Sig Start Date End Date Taking? Authorizing Provider  acetaminophen (TYLENOL) 650 MG CR tablet Take 1,300 mg by mouth every 8 (eight) hours as needed for pain.   Yes [provider]  albuterol (VENTOLIN HFA) 108 (90 Base) MCG/ACT inhaler Inhale 2 puffs into the lungs every 4 (four) hours as needed for shortness of breath. 12/26/21  Yes Kasa, [name], MD  azelastine (ASTELIN) 0.1 % nasal spray Place 2 sprays into both nostrils 2 (two) times daily. 03/29/21  Yes Flora Lipps, MD  Cholecalciferol 125 MCG (5000 UT) TABS Take 5,000 Units by mouth daily.   Yes [provider]  clonazePAM (KLONOPIN) 0.5 MG tablet TAKE 1 TABLET BY MOUTH TWICE A DAY AS NEEDED FOR ANXIETY 06/18/22  Yes Samaria Anes, Ines Bloomer, MD  fluticasone (FLONASE) 50 MCG/ACT nasal spray SPRAY 1 SPRAY INTO BOTH NOSTRILS DAILY. 05/13/22  Yes Tyler Pita, MD  fluticasone (FLOVENT HFA) 220 MCG/ACT inhaler Inhale 2 puffs into the lungs 2 (two) times daily. 12/26/21 12/26/22 Yes Kasa, [name], MD  hydrOXYzine (ATARAX) 25 MG tablet TAKE 1 TABLET BY MOUTH EVERY 8 HOURS AS NEEDED FOR ANXIETY 05/23/22  Yes Garima Chronis, Ines Bloomer, MD  ipratropium-albuterol (DUONEB) 0.5-2.5 (3) MG/3ML SOLN Inhale 3 mLs into the lungs every 6 (six) hours as needed. 07/09/22  Yes Kasa, [name], MD  polyethylene glycol powder (GLYCOLAX/MIRALAX) 17 GM/SCOOP powder Take 17 g by mouth daily as needed for constipation. 02/27/21  Yes [provider]   umeclidinium-vilanterol (ANORO ELLIPTA) 62.5-25 MCG/ACT AEPB Inhale 1 puff into the lungs daily. 05/27/22  Yes Flora Lipps, MD  predniSONE (DELTASONE) 20 MG tablet Take 1 tablet (20 mg total) by mouth daily with breakfast. 10 days Patient not taking: Reported on 07/22/2022 11/05/21   Flora Lipps, MD  predniSONE (DELTASONE) 20 MG tablet Take 1 tablet (20 mg total) by mouth daily with breakfast. 10 days Patient not taking: Reported on 07/22/2022 12/26/21   Flora Lipps, MD    Allergies  Allergen Reactions   Sulfonamide Derivatives Other (See Comments)    UNK reaction from childhood    Patient Active Problem List   Diagnosis Date Noted   Chronic obstructive pulmonary disease 01/14/2022   Auditory complaints of both ears 01/14/2022   History of elevated PSA 07/11/2021   Prediabetes 07/11/2021   History of COPD 07/11/2021   Dyslipidemia 12/16/2006    Past Medical History:  Diagnosis Date   COPD (chronic obstructive pulmonary disease) ([place])     Past Surgical History:  Procedure Laterality Date   APPENDECTOMY     CHOLECYSTECTOMY      Social History   Socioeconomic History   Marital status: Widowed    Spouse name: Not on file   Number of children: Not on file   Years of education: Not on file   Highest education level: Some college, no degree  Occupational History   Not on file  Tobacco Use   Smoking status: Former    Years: 2  Types: Cigarettes    Quit date: 53    Years since quitting: 70.2   Smokeless tobacco: Never  Substance and Sexual Activity   Alcohol use: Not Currently   Drug use: Never   Sexual activity: Not on file  Other Topics Concern   Not on file  Social History Narrative   Not on file   Social Determinants of Health   Financial Resource Strain: Low Risk  (07/14/2022)   Overall Financial Resource Strain (CARDIA)    Difficulty of Paying Living Expenses: Not hard at all  Food Insecurity: No Food Insecurity (07/14/2022)   Hunger Vital Sign    Worried  About Running Out of Food in the Last Year: Never true    Ran Out of Food in the Last Year: Never true  Transportation Needs: No Transportation Needs (07/14/2022)   PRAPARE - Hydrologist (Medical): No    Lack of Transportation (Non-Medical): No  Physical Activity: Sufficiently Active (07/14/2022)   Exercise Vital Sign    Days of Exercise per Week: 4 days    Minutes of Exercise per Session: 60 min  Stress: No Stress Concern Present (07/14/2022)   [place]    Feeling of Stress : Only a little  Social Connections: Socially Isolated (07/14/2022)   Social Connection and Isolation Panel [NHANES]    Frequency of Communication with Friends and Family: Once a week    Frequency of Social Gatherings with Friends and Family: Once a week    Attends Religious Services: Never    Marine scientist or Organizations: No    Attends Music therapist: Not on file    Marital Status: Widowed  Intimate Partner Violence: Not on file    No family history on file.   Review of Systems  Constitutional: Negative.  Negative for chills and fever.  HENT: Negative.  Negative for congestion and sore throat.   Respiratory: Negative.  Negative for cough and shortness of breath.   Cardiovascular: Negative.  Negative for chest pain and palpitations.  Gastrointestinal:  Negative for abdominal pain, diarrhea, nausea and vomiting.  Genitourinary:  Positive for frequency.       Nocturia  Skin: Negative.  Negative for rash.  Neurological: Negative.  Negative for dizziness and headaches.  All other systems reviewed and are negative.   Vitals:   07/22/22 1046  BP: 130/72  Pulse: 76  Temp: 98.1 F (36.7 C)  SpO2: 95%    Physical Exam Vitals reviewed.  Constitutional:      Appearance: Normal appearance.  HENT:     Head: Normocephalic.     Mouth/Throat:     Mouth: Mucous membranes are moist.      Pharynx: Oropharynx is clear.  Eyes:     Extraocular Movements: Extraocular movements intact.     Pupils: Pupils are equal, round, and reactive to light.  Cardiovascular:     Rate and Rhythm: Normal rate and regular rhythm.     Pulses: Normal pulses.     Heart sounds: Normal heart sounds.  Pulmonary:     Effort: Pulmonary effort is normal.     Breath sounds: Normal breath sounds.  Musculoskeletal:     Cervical back: No tenderness.     Right lower leg: No edema.     Left lower leg: No edema.  Lymphadenopathy:     Cervical: No cervical adenopathy.  Skin:    General: Skin is warm and  dry.     Capillary Refill: Capillary refill takes less than 2 seconds.  Neurological:     General: No focal deficit present.     Mental Status: He is alert and oriented to person, place, and time.  Psychiatric:        Mood and Affect: Mood normal.        Behavior: Behavior normal.      ASSESSMENT & PLAN: A total of 44 minutes was spent with the patient and counseling/coordination of care regarding preparing for this visit, review of most recent office visit notes, review of chronic medical conditions and their management, review of all medications, review of most recent blood work results, education on nutrition, prognosis, documentation, need for follow-up.  Problem List Items Addressed This Visit       Respiratory   Chronic obstructive pulmonary disease - Primary    Recovered well from recent flareup Took Zithromax and prednisone Also increased frequency of nebulizers Much improved today.        Other   History of elevated PSA    With lower urinary tract symptoms and occasional nocturia interfering with quality of sleep. Has appointment to follow-up with urologist in 2 weeks Possible prostate cancer      Prediabetes    Eating well and staying physically active as much as possible      Patient Instructions  Health Maintenance After Age 36 After age 44, you are at a higher risk for  certain long-term diseases and infections as well as injuries from falls. Falls are a major cause of broken bones and head injuries in people who are older than age 109. Getting regular preventive care can help to keep you healthy and well. Preventive care includes getting regular testing and making lifestyle changes as recommended by your health care provider. Talk with your health care provider about: Which screenings and tests you should have. A screening is a test that checks for a disease when you have no symptoms. A diet and exercise plan that is right for you. What should I know about screenings and tests to prevent falls? Screening and testing are the best ways to find a health problem early. Early diagnosis and treatment give you the best chance of managing medical conditions that are common after age 15. Certain conditions and lifestyle choices may make you more likely to have a fall. Your health care provider may recommend: Regular vision checks. Poor vision and conditions such as cataracts can make you more likely to have a fall. If you wear glasses, make sure to get your prescription updated if your vision changes. Medicine review. Work with your health care provider to regularly review all of the medicines you are taking, including over-the-counter medicines. Ask your health care provider about any side effects that may make you more likely to have a fall. Tell your health care provider if any medicines that you take make you feel dizzy or sleepy. Strength and balance checks. Your health care provider may recommend certain tests to check your strength and balance while standing, walking, or changing positions. Foot health exam. Foot pain and numbness, as well as not wearing proper footwear, can make you more likely to have a fall. Screenings, including: Osteoporosis screening. Osteoporosis is a condition that causes the bones to get weaker and break more easily. Blood pressure screening.  Blood pressure changes and medicines to control blood pressure can make you feel dizzy. Depression screening. You may be more likely to have a fall  if you have a fear of falling, feel depressed, or feel unable to do activities that you used to do. Alcohol use screening. Using too much alcohol can affect your balance and may make you more likely to have a fall. Follow these instructions at home: Lifestyle Do not drink alcohol if: Your health care provider tells you not to drink. If you drink alcohol: Limit how much you have to: 0-1 drink a day for women. 0-2 drinks a day for men. Know how much alcohol is in your drink. In the [place] one drink equals one 12 oz bottle of beer (355 mL), one 5 oz glass of wine (148 mL), or one 1 oz glass of hard liquor (44 mL). Do not use any products that contain nicotine or tobacco. These products include cigarettes, chewing tobacco, and vaping devices, such as e-cigarettes. If you need help quitting, ask your health care provider. Activity  Follow a regular exercise program to stay fit. This will help you maintain your balance. Ask your health care provider what types of exercise are appropriate for you. If you need a cane or walker, use it as recommended by your health care provider. Wear supportive shoes that have nonskid soles. Safety  Remove any tripping hazards, such as rugs, cords, and clutter. Install safety equipment such as grab bars in bathrooms and safety rails on stairs. Keep rooms and walkways well-lit. General instructions Talk with your health care provider about your risks for falling. Tell your health care provider if: You fall. Be sure to tell your health care provider about all falls, even ones that seem minor. You feel dizzy, tiredness (fatigue), or off-balance. Take over-the-counter and prescription medicines only as told by your health care provider. These include supplements. Eat a healthy diet and maintain a healthy weight. A healthy  diet includes low-fat dairy products, low-fat (lean) meats, and fiber from whole grains, beans, and lots of fruits and vegetables. Stay current with your vaccines. Schedule regular health, dental, and eye exams. Summary Having a healthy lifestyle and getting preventive care can help to protect your health and wellness after age 64. Screening and testing are the best way to find a health problem early and help you avoid having a fall. Early diagnosis and treatment give you the best chance for managing medical conditions that are more common for people who are older than age 35. Falls are a major cause of broken bones and head injuries in people who are older than age 87. Take precautions to prevent a fall at home. Work with your health care provider to learn what changes you can make to improve your health and wellness and to prevent falls. This information is not intended to replace advice given to you by your health care provider. Make sure you discuss any questions you have with your health care provider. Document Revised: 08/28/2020 Document Reviewed: 08/28/2020 Elsevier Patient Education  [place], MD [facility]

## 2022-07-22 NOTE — Assessment & Plan Note (Addendum)
With lower urinary tract symptoms and occasional nocturia interfering with quality of sleep. Has appointment to follow-up with urologist in 2 weeks Possible prostate cancer

## 2022-08-09 DIAGNOSIS — C61 Malignant neoplasm of prostate: Secondary | ICD-10-CM | POA: Diagnosis not present

## 2022-08-09 DIAGNOSIS — R972 Elevated prostate specific antigen [PSA]: Secondary | ICD-10-CM | POA: Diagnosis not present

## 2022-08-20 ENCOUNTER — Other Ambulatory Visit (HOSPITAL_COMMUNITY): Payer: Self-pay | Admitting: Urology

## 2022-08-20 ENCOUNTER — Encounter: Payer: Self-pay | Admitting: Emergency Medicine

## 2022-08-20 DIAGNOSIS — C61 Malignant neoplasm of prostate: Secondary | ICD-10-CM

## 2022-08-23 ENCOUNTER — Other Ambulatory Visit: Payer: Self-pay | Admitting: Internal Medicine

## 2022-08-23 DIAGNOSIS — J449 Chronic obstructive pulmonary disease, unspecified: Secondary | ICD-10-CM

## 2022-08-23 NOTE — Telephone Encounter (Signed)
Per the patient's pharmacy, his insurance will not pay for Flovent. Please reach out to the patient's pharmacy for a cheaper alternative to Flovent. Thank you!

## 2022-08-26 ENCOUNTER — Other Ambulatory Visit (HOSPITAL_COMMUNITY): Payer: Self-pay

## 2022-08-26 NOTE — Telephone Encounter (Signed)
Covered alternative in same class is Arnuity with a co-pay of $47.00

## 2022-08-27 ENCOUNTER — Encounter: Payer: Self-pay | Admitting: Internal Medicine

## 2022-08-28 NOTE — Telephone Encounter (Signed)
Pharm Team- can you check with his insurance and see what alternative to Flovent that they do cover? Thank you!   

## 2022-09-03 NOTE — Telephone Encounter (Signed)
Pharm Team- can you check with his insurance and see what alternative to Flovent that they do cover? Thank you!

## 2022-09-04 ENCOUNTER — Telehealth: Payer: Self-pay

## 2022-09-04 NOTE — Telephone Encounter (Signed)
The patient's daughter called saying his insurance will not cover his Flovent.  Can you please reach out to his insurance and find out what alternative inhaler they will pay for?

## 2022-09-05 ENCOUNTER — Ambulatory Visit (HOSPITAL_COMMUNITY)
Admission: RE | Admit: 2022-09-05 | Discharge: 2022-09-05 | Disposition: A | Discharge status: Home / Self Care | Payer: Medicare HMO | Source: Ambulatory Visit | Attending: Urology | Admitting: Urology

## 2022-09-05 ENCOUNTER — Other Ambulatory Visit (HOSPITAL_COMMUNITY): Payer: Self-pay

## 2022-09-05 DIAGNOSIS — C61 Malignant neoplasm of prostate: Secondary | ICD-10-CM

## 2022-09-05 MED ORDER — PIFLIFOLASTAT F 18 (PYLARIFY) INJECTION
9.0000 | Freq: Once | INTRAVENOUS | Status: AC
  Administered 2022-09-05: 9.9 via INTRAVENOUS

## 2022-09-05 NOTE — Telephone Encounter (Signed)
Per test claim of Flovent HFA: Arnuity is preferred.  Test claim of Arnuity shows co-pay of $141.00/90 days

## 2022-09-05 NOTE — Telephone Encounter (Signed)
Message was added to the Patient Advise request sent on 08/27/2022 and sent to Dr. Belia Heman.  Nothing further needed in this telephone encounter.

## 2022-09-05 NOTE — Telephone Encounter (Addendum)
Dr. Belia Heman please advise. Flovent is not covered and Arnuity is preferred.

## 2022-09-05 NOTE — Telephone Encounter (Signed)
  Brian Hopkins, CPhT Pharmacy Technician Specialty: Pharmacy Technician   Telephone Encounter Signed   Creation Time: 09/05/2022 10:44 AM   Signed     Per test claim of Flovent HFA: Arnuity is preferred.   Test claim of Arnuity shows co-pay of $141.00/90 days

## 2022-09-10 MED ORDER — ARNUITY ELLIPTA 200 MCG/ACT IN AEPB: 1.0000 | INHALATION_SPRAY | Freq: Every day | RESPIRATORY_TRACT | 5 refills | Status: DC

## 2022-09-10 NOTE — Telephone Encounter (Signed)
Dr. Kasa, please advise. Thanks °

## 2022-09-11 DIAGNOSIS — C61 Malignant neoplasm of prostate: Secondary | ICD-10-CM | POA: Diagnosis not present

## 2022-09-18 ENCOUNTER — Other Ambulatory Visit: Payer: Self-pay | Admitting: Urology

## 2022-10-02 ENCOUNTER — Encounter (HOSPITAL_BASED_OUTPATIENT_CLINIC_OR_DEPARTMENT_OTHER): Payer: Self-pay | Admitting: Urology

## 2022-10-02 NOTE — Progress Notes (Signed)
Called spoke w/ pt daughter to complete pre-op interview for surgery on 10-17-2022 @ [facility].  Pt daughter stated pt unsure about having surgery thinking about having the hormone injection's.  Daughter stated he has a urology appointment  on 06/ 19 for final decision. Will call back on 06/ 20 if pt is still on surgical schedule.

## 2022-10-09 ENCOUNTER — Encounter (HOSPITAL_BASED_OUTPATIENT_CLINIC_OR_DEPARTMENT_OTHER): Payer: Self-pay | Admitting: Urology

## 2022-10-09 DIAGNOSIS — N401 Enlarged prostate with lower urinary tract symptoms: Secondary | ICD-10-CM | POA: Diagnosis not present

## 2022-10-09 NOTE — Progress Notes (Signed)
Spoke w/ via phone for pre-op interview---  pt's daughter, Brian Hopkins needs dos----    no           Hopkins results------ no COVID test -----patient states asymptomatic no test needed Arrive at ------- 0930 on 10-17-2022 NPO after MN NO Solid Food.  Clear liquids from MN until--- 0830 Med rec completed Medications to take morning of surgery ----- hydroxyzine if needed, arono inhaler, do nebulizer night before surgery Diabetic medication ----- n/a Patient instructed no nail polish to be worn day of surgery Patient instructed to bring photo id and insurance card day of surgery Patient aware to have Driver (ride ) / caregiver    for 24 hours after surgery -- daughter, Brian Patient Special Instructions ----- asked to bring rescue inhaler dos Pre-Op special Instructions -----  pt is very HOH and some loss of vision left eye,  will need daughter in pre-op Patient verbalized understanding of instructions that were given at this phone interview. Patient denies shortness of breath, chest pain, fever, cough at this phone interview.

## 2022-10-10 ENCOUNTER — Other Ambulatory Visit: Payer: Self-pay | Admitting: Internal Medicine

## 2022-10-10 ENCOUNTER — Encounter: Payer: Self-pay | Admitting: Internal Medicine

## 2022-10-10 MED ORDER — AZITHROMYCIN 250 MG PO TABS: ORAL_TABLET | ORAL | 0 refills | Status: DC

## 2022-10-10 NOTE — Telephone Encounter (Signed)
Dr. Belia Heman is out of the office. Dr. Jayme Cloud can you advise if it is ok to send in a Zpak for this patient?

## 2022-10-10 NOTE — Telephone Encounter (Signed)
Seems like Dr. Belia Heman calls that to him as needed.  We can send prescription for 1 pack no refills.

## 2022-10-14 ENCOUNTER — Encounter: Payer: Self-pay | Admitting: Emergency Medicine

## 2022-10-15 ENCOUNTER — Encounter: Payer: Self-pay | Admitting: Internal Medicine

## 2022-10-15 ENCOUNTER — Ambulatory Visit (INDEPENDENT_AMBULATORY_CARE_PROVIDER_SITE_OTHER): Payer: Medicare HMO

## 2022-10-15 VITALS — Ht 69.0 in | Wt 211.0 lb

## 2022-10-15 DIAGNOSIS — Z0000 Encounter for general adult medical examination without abnormal findings: Secondary | ICD-10-CM | POA: Diagnosis not present

## 2022-10-15 NOTE — Telephone Encounter (Signed)
Dr. Belia Heman, please advise. There is a message in his chart from 10/10/2022 where they were asking for a z-pak to be sent in. Dr. Jayme Cloud sent it in but it does not sound like he has been taking it.

## 2022-10-15 NOTE — Progress Notes (Signed)
Subjective:   Brian Hopkins is a 89 y.o. male who presents for an Initial Medicare Annual Wellness Visit.  Visit Complete: Virtual  I connected with  Brian Hopkins on 10/17/22 by a audio enabled telemedicine application and verified that I am speaking with the correct person using two identifiers.  Patient Location: Home  Provider Location: Office/Clinic  I discussed the limitations of evaluation and management by telemedicine. The patient expressed understanding and agreed to proceed.  Review of Systems     Cardiac Risk Factors include: advanced age (>49men, >63 women);male gender;obesity (BMI >30kg/m2);sedentary lifestyle     Objective:    Today's Vitals   10/15/22 1334  Weight: 211 lb (95.7 kg)  Height: 5\' 9"  (1.753 m)  PainSc: 0-No pain   Body mass index is 31.16 kg/m.     10/17/2022    9:47 AM 10/15/2022    1:37 PM 11/13/2021    5:54 PM 03/13/2021   11:34 AM  Advanced Directives  Does Patient Have a Medical Advance Directive?  Yes No No  Type of Advance Directive Living will;Healthcare Power of [employer] Power of [place];Living will    Copy of Healthcare Power of Attorney in Chart? No - copy requested No - copy requested    Would patient like information on creating a medical advance directive?   No - Patient declined No - Patient declined    Current Medications (verified) No facility-administered encounter medications on file as of 10/15/2022.   Outpatient Encounter Medications as of 10/15/2022  Medication Sig   acetaminophen (TYLENOL) 650 MG CR tablet Take 1,300 mg by mouth every 8 (eight) hours as needed for pain.   albuterol (VENTOLIN HFA) 108 (90 Base) MCG/ACT inhaler Inhale 2 puffs into the lungs every 4 (four) hours as needed for shortness of breath.   aluminum hydroxide-magnesium carbonate (GAVISCON) 95-358 MG/15ML SUSP Take by mouth as needed for heartburn or indigestion.   azelastine (ASTELIN) 0.1 % nasal spray Place 2 sprays into both  nostrils 2 (two) times daily. (Patient taking differently: Place 2 sprays into both nostrils 2 (two) times daily as needed for rhinitis or allergies.)   azithromycin (ZITHROMAX) 250 MG tablet Take 2 tablets (500 mg) on  Day 1,  followed by 1 tablet (250 mg) once daily on Days 2 through 5.   clonazePAM (KLONOPIN) 0.5 MG tablet TAKE 1 TABLET BY MOUTH TWICE A DAY AS NEEDED FOR ANXIETY (Patient taking differently: Take 0.5 mg by mouth 2 (two) times daily as needed for anxiety.)   fluticasone (FLONASE) 50 MCG/ACT nasal spray SPRAY 1 SPRAY INTO BOTH NOSTRILS DAILY. (Patient taking differently: Place 1 spray into both nostrils at bedtime.)   Fluticasone Furoate (ARNUITY ELLIPTA) 200 MCG/ACT AEPB Inhale 1 puff into the lungs daily. (Patient taking differently: Inhale 1 puff into the lungs daily as needed.)   hydrOXYzine (ATARAX) 25 MG tablet TAKE 1 TABLET BY MOUTH EVERY 8 HOURS AS NEEDED FOR ANXIETY (Patient taking differently: Take 25 mg by mouth every 8 (eight) hours as needed for anxiety.)   Ibuprofen-Acetaminophen (ADVIL DUAL ACTION) 125-250 MG TABS Take 2 tablets by mouth every 8 (eight) hours as needed.   ipratropium-albuterol (DUONEB) 0.5-2.5 (3) MG/3ML SOLN Inhale 3 mLs into the lungs every 6 (six) hours as needed.   magnesium hydroxide (MILK OF MAGNESIA) 400 MG/5ML suspension Take by mouth daily as needed for mild constipation.   Multiple Vitamin (MULTIVITAMIN) capsule Take 1 capsule by mouth daily.   Polyvinyl Alcohol-Povidone (REFRESH OP) Apply  to eye as needed.   predniSONE (DELTASONE) 20 MG tablet Take 1 tablet (20 mg total) by mouth daily with breakfast. 10 days   umeclidinium-vilanterol (ANORO ELLIPTA) 62.5-25 MCG/ACT AEPB Inhale 1 puff into the lungs daily.    Allergies (verified) Statins, Augmentin [amoxicillin-pot clavulanate], and Sulfa antibiotics   History: Past Medical History:  Diagnosis Date   Age-related macular degeneration, dry, right eye    Age-related macular degeneration,  wet, left eye (HCC)    10-09-2022  per pt eye injection's every 8 weeks   COPD (chronic obstructive pulmonary disease) (HCC)    pulmnologist--- dr Army Chaco;  mild copd w/ emphysema;   does not have oxygen   Decreased vision of left eye    DOE (dyspnea on exertion)    Dyslipidemia    GERD (gastroesophageal reflux disease)    Hiatal hernia    History of ulcerative colitis 2010   (10-09-2022  per pt has not had issues in several years)  previously followed by [facility] last seen 2017 (office note in care everywhere) -- left sided UC  in clinical remission ,  dx 2010 ,   History of vertebral compression fracture 2022   T8   HOH (hard of hearing)    left > right   Malignant neoplasm prostate (HCC) 07/2022   urologist--- dr Retta Diones;  bx 04/ 2024, Gleason 4+3   Nocturia    OSA (obstructive sleep apnea) 2017   pulmonologist-- dr Erin Fulling;   stopped using cpap   PVC's (premature ventricular contractions)    Seasonal allergies    Past Surgical History:  Procedure Laterality Date   APPENDECTOMY  1957   open   CATARACT EXTRACTION W/ INTRAOCULAR LENS IMPLANT Right 2019   CHOLECYSTECTOMY OPEN  1985   COLONOSCOPY  2015   KNEE SURGERY Left 1952   per pt tumor removed, benign   TONSILLECTOMY     child   History reviewed. No pertinent family history. Social History   Socioeconomic History   Marital status: Widowed    Spouse name: Not on file   Number of children: Not on file   Years of education: Not on file   Highest education level: Some college, no degree  Occupational History   Not on file  Tobacco Use   Smoking status: Former    Years: 2    Types: Cigarettes    Quit date: 1954    Years since quitting: 70.5   Smokeless tobacco: Never  Vaping Use   Vaping Use: Never used  Substance and Sexual Activity   Alcohol use: Not Currently   Drug use: Never   Sexual activity: Not on file  Other Topics Concern   Not on file  Social History Narrative   Not on file   Social  Determinants of Health   Financial Resource Strain: Low Risk  (07/14/2022)   Overall Financial Resource Strain (CARDIA)    Difficulty of Paying Living Expenses: Not hard at all  Food Insecurity: No Food Insecurity (07/14/2022)   Hunger Vital Sign    Worried About Running Out of Food in the Last Year: Never true    Ran Out of Food in the Last Year: Never true  Transportation Needs: No Transportation Needs (07/14/2022)   PRAPARE - Administrator, Civil Service (Medical): No    Lack of Transportation (Non-Medical): No  Physical Activity: Sufficiently Active (07/14/2022)   Exercise Vital Sign    Days of Exercise per Week: 4 days  Minutes of Exercise per Session: 60 min  Stress: No Stress Concern Present (07/14/2022)   [employer] of Occupational Health - Occupational Stress Questionnaire    Feeling of Stress : Only a little  Social Connections: Socially Isolated (07/14/2022)   Social Connection and Isolation Panel [NHANES]    Frequency of Communication with Friends and Family: Once a week    Frequency of Social Gatherings with Friends and Family: Once a week    Attends Religious Services: Never    Database administrator or Organizations: No    Attends Engineer, structural: Not on file    Marital Status: Widowed    Tobacco Counseling Counseling given: Not Answered   Clinical Intake:  Pre-visit preparation completed: Yes  Pain : No/denies pain Pain Score: 0-No pain     BMI - recorded: 31.16 Nutritional Status: BMI > 30  Obese Nutritional Risks: None Diabetes: No  How often do you need to have someone help you when you read instructions, pamphlets, or other written materials from your doctor or pharmacy?: 1 - Never What is the last grade level you completed in school?: HSG  Interpreter Needed?: No  Information entered by :: Laasya Peyton N. Cammeron Greis, LPN.   Activities of Daily Living    10/17/2022    9:54 AM 10/15/2022    1:42 PM  In your present  state of health, do you have any difficulty performing the following activities:  Hearing? 1 0  Vision? 0 0  Difficulty concentrating or making decisions? 1 0  Comment sometmes   Walking or climbing stairs? 0 0  Dressing or bathing? 0 0  Doing errands, shopping?  0  Preparing Food and eating ?  N  Using the Toilet?  N  In the past six months, have you accidently leaked urine?  N  Do you have problems with loss of bowel control?  N  Managing your Medications?  N  Managing your Finances?  N  Housekeeping or managing your Housekeeping?  N    Patient Care Team: Georgina Quint, MD as PCP - General (Internal Medicine)  Indicate any recent Medical Services you may have received from other than [facility] providers in the past year (date may be approximate).     Assessment:   This is a routine wellness examination for Brian Hopkins.  Hearing/Vision screen Hearing Screening - Comments:: Denies hearing difficulties   Vision Screening - Comments:: Wears rx glasses - up to date with routine eye exams with [facility]   Dietary issues and exercise activities discussed:     Goals Addressed             This Visit's Progress    Client and daughter understands the importance of follow-up with providers by attending scheduled visits.        Depression Screen    10/15/2022    1:42 PM 07/22/2022   10:48 AM 01/14/2022   10:22 AM 12/18/2021    3:41 PM 07/11/2021   10:56 AM  PHQ 2/9 Scores  PHQ - 2 Score 0 0 0 0 0  PHQ- 9 Score 0        Fall Risk    10/17/2022   10:07 AM 07/22/2022   10:48 AM 01/14/2022   10:22 AM 12/18/2021    3:41 PM 07/11/2021   10:58 AM  Fall Risk   Falls in the past year? 0 0 0 0 1  Number falls in past yr: 0 0 0 0 0  Injury  with Fall? 0 0 0 0 1   Comment     brusing  Risk for fall due to : No Fall Risks No Fall Risks No Fall Risks No Fall Risks   Follow up Falls prevention discussed Falls evaluation completed Falls evaluation completed Falls  evaluation completed      Significant value    MEDICARE RISK AT HOME:    TIMED UP AND GO:  Was the test performed? No    Cognitive Function:        10/15/2022    1:42 PM  6CIT Screen  What Year? 0 points  What month? 0 points  What time? 0 points  Count back from 20 0 points  Months in reverse 0 points  Repeat phrase 0 points  Total Score 0 points    Immunizations Immunization History  Administered Date(s) Administered   Fluad Quad(high Dose 65+) 01/14/2022   H1N1 12/12/2014   Influenza Whole 04/22/2005, 01/23/2007   Influenza, High Dose Seasonal PF 01/04/2014, 12/20/2016, 12/25/2017, 12/10/2018, 12/10/2018   Influenza-Unspecified 01/08/2009, 12/22/2012   PFIZER Comirnaty(Gray Top)Covid-19 Tri-Sucrose Vaccine 05/28/2019, 06/19/2019, 01/26/2020, 08/17/2020   Pneumococcal Conjugate-13 08/23/2013, 08/24/2013, 05/17/2016   Pneumococcal Polysaccharide-23 04/22/2000, 12/15/2005   Respiratory Syncytial Virus Vaccine,Recomb Aduvanted(Arexvy) 02/27/2022   Td 04/22/1996, 01/18/2007   Td (Adult),5 Lf Tetanus Toxid, Preservative Free 04/22/1996, 01/18/2007   Tdap 04/22/1996, 12/15/2005, 01/18/2007, 10/25/2017, 10/25/2018   Zoster Recombinat (Shingrix) 07/28/2017, 10/24/2017, 10/25/2017   Zoster, Live 11/14/2010    TDAP status: Up to date  Flu Vaccine status: Up to date  Pneumococcal vaccine status: Up to date  Covid-19 vaccine status: Completed vaccines  Qualifies for Shingles Vaccine? Yes   Zostavax completed No   Shingrix Completed?: Yes  Screening Tests Health Maintenance  Topic Date Due   COVID-19 Vaccine (5 - 2023-24 season) 12/21/2021   INFLUENZA VACCINE  11/21/2022   Medicare Annual Wellness (AWV)  10/15/2023   DTaP/Tdap/Td (10 - Td or Tdap) 10/24/2028   Pneumonia Vaccine 65+ Years old  Completed   Zoster Vaccines- Shingrix  Completed   HPV VACCINES  Aged Out    Health Maintenance  Health Maintenance Due  Topic Date Due   COVID-19 Vaccine (5 -  2023-24 season) 12/21/2021    Colorectal cancer screening: No longer required.   Lung Cancer Screening: (Low Dose CT Chest recommended if Age 16-80 years, 20 pack-year currently smoking OR have quit w/in 15years.) does not qualify.   Lung Cancer Screening Referral: no  Additional Screening:  Hepatitis C Screening: does not qualify; Completed: no  Vision Screening: Recommended annual ophthalmology exams for early detection of glaucoma and other disorders of the eye. Is the patient up to date with their annual eye exam?  Yes  Who is the provider or what is the name of the office in which the patient attends annual eye exams? [facility] If pt is not established with a provider, would they like to be referred to a provider to establish care? No .   Dental Screening: Recommended annual dental exams for proper oral hygiene  Diabetic Foot Exam: N/A  Community Resource Referral / Chronic Care Management: CRR required this visit?  No   CCM required this visit?  No    Plan:     I have personally reviewed and noted the following in the patient's chart:   Medical and social history Use of alcohol, tobacco or illicit drugs  Current medications and supplements including opioid prescriptions. Patient is not currently taking opioid prescriptions. Functional  ability and status Nutritional status Physical activity Advanced directives List of other physicians Hospitalizations, surgeries, and ER visits in previous 12 months Vitals Screenings to include cognitive, depression, and falls Referrals and appointments  In addition, I have reviewed and discussed with patient certain preventive protocols, quality metrics, and best practice recommendations. A written personalized care plan for preventive services as well as general preventive health recommendations were provided to patient.     Mickeal Needy, LPN   [id number]   After Visit Summary: (Mail) Due to this being  a telephonic visit, the after visit summary with patients personalized plan was offered to patient via mail   Nurse Notes: Patient's daughter, Brian Asp helped with visit for today. Normal cognitive status assessed by direct observation via telephone conversation by this Nurse Health Advisor. No abnormalities found.  No falls within the pass 12 months.

## 2022-10-15 NOTE — Patient Instructions (Addendum)
[name] , Thank you for taking time to come for your Medicare Wellness Visit. I appreciate your ongoing commitment to your health goals. Please review the following plan we discussed and let me know if I can assist you in the future.   These are the goals we discussed:  Goals      Client and daughter understands the importance of follow-up with providers by attending scheduled visits.        This is a list of the screening recommended for you and due dates:  Health Maintenance  Topic Date Due   COVID-19 Vaccine (5 - 2023-24 season) 12/21/2021   Flu Shot  11/21/2022   Medicare Annual Wellness Visit  10/15/2023   DTaP/Tdap/Td vaccine (10 - Td or Tdap) 10/24/2028   Pneumonia Vaccine  Completed   Zoster (Shingles) Vaccine  Completed   HPV Vaccine  Aged Out    Advanced directives: Yes; Please bring a copy of your health care power of attorney and living will to the office at your convenience.  Conditions/risks identified: Yes  Next appointment: It was nice speaking with you today!  Please follow up in one year for your annual wellness visit via telephone call with Nurse Percell Miller on 10/22/2023 at 1:30 p.m.  If you need to cancel or reschedule please call [phone].  Preventive Care 44 Years and Older, Male  Preventive care refers to lifestyle choices and visits with your health care provider that can promote health and wellness. What does preventive care include? A yearly physical exam. This is also called an annual well check. Dental exams once or twice a year. Routine eye exams. Ask your health care provider how often you should have your eyes checked. Personal lifestyle choices, including: Daily care of your teeth and gums. Regular physical activity. Eating a healthy diet. Avoiding tobacco and drug use. Limiting alcohol use. Practicing safe sex. Taking low doses of aspirin every day. Taking vitamin and mineral supplements as recommended by your health care provider. What  happens during an annual well check? The services and screenings done by your health care provider during your annual well check will depend on your age, overall health, lifestyle risk factors, and family history of disease. Counseling  Your health care provider may ask you questions about your: Alcohol use. Tobacco use. Drug use. Emotional well-being. Home and relationship well-being. Sexual activity. Eating habits. History of falls. Memory and ability to understand (cognition). Work and work Astronomer. Screening  You may have the following tests or measurements: Height, weight, and BMI. Blood pressure. Lipid and cholesterol levels. These may be checked every 5 years, or more frequently if you are over 53 years old. Skin check. Lung cancer screening. You may have this screening every year starting at age 60 if you have a 30-pack-year history of smoking and currently smoke or have quit within the past 15 years. Fecal occult blood test (FOBT) of the stool. You may have this test every year starting at age 82. Flexible sigmoidoscopy or colonoscopy. You may have a sigmoidoscopy every 5 years or a colonoscopy every 10 years starting at age 8. Prostate cancer screening. Recommendations will vary depending on your family history and other risks. Hepatitis C blood test. Hepatitis B blood test. Sexually transmitted disease (STD) testing. Diabetes screening. This is done by checking your blood sugar (glucose) after you have not eaten for a while (fasting). You may have this done every 1-3 years. Abdominal aortic aneurysm (AAA) screening. You may need this if  you are a current or former smoker. Osteoporosis. You may be screened starting at age 8 if you are at high risk. Talk with your health care provider about your test results, treatment options, and if necessary, the need for more tests. Vaccines  Your health care provider may recommend certain vaccines, such as: Influenza vaccine. This  is recommended every year. Tetanus, diphtheria, and acellular pertussis (Tdap, Td) vaccine. You may need a Td booster every 10 years. Zoster vaccine. You may need this after age 31. Pneumococcal 13-valent conjugate (PCV13) vaccine. One dose is recommended after age 53. Pneumococcal polysaccharide (PPSV23) vaccine. One dose is recommended after age 19. Talk to your health care provider about which screenings and vaccines you need and how often you need them. This information is not intended to replace advice given to you by your health care provider. Make sure you discuss any questions you have with your health care provider. Document Released: 05/05/2015 Document Revised: 12/27/2015 Document Reviewed: 02/07/2015 Elsevier Interactive Patient Education  2017 [employer].  Fall Prevention in the Home Falls can cause injuries. They can happen to people of all ages. There are many things you can do to make your home safe and to help prevent falls. What can I do on the outside of my home? Regularly fix the edges of walkways and driveways and fix any cracks. Remove anything that might make you trip as you walk through a door, such as a raised step or threshold. Trim any bushes or trees on the path to your home. Use bright outdoor lighting. Clear any walking paths of anything that might make someone trip, such as rocks or tools. Regularly check to see if handrails are loose or broken. Make sure that both sides of any steps have handrails. Any raised decks and porches should have guardrails on the edges. Have any leaves, snow, or ice cleared regularly. Use sand or salt on walking paths during winter. Clean up any spills in your garage right away. This includes oil or grease spills. What can I do in the bathroom? Use night lights. Install grab bars by the toilet and in the tub and shower. Do not use towel bars as grab bars. Use non-skid mats or decals in the tub or shower. If you need to sit down  in the shower, use a plastic, non-slip stool. Keep the floor dry. Clean up any water that spills on the floor as soon as it happens. Remove soap buildup in the tub or shower regularly. Attach bath mats securely with double-sided non-slip rug tape. Do not have throw rugs and other things on the floor that can make you trip. What can I do in the bedroom? Use night lights. Make sure that you have a light by your bed that is easy to reach. Do not use any sheets or blankets that are too big for your bed. They should not hang down onto the floor. Have a firm chair that has side arms. You can use this for support while you get dressed. Do not have throw rugs and other things on the floor that can make you trip. What can I do in the kitchen? Clean up any spills right away. Avoid walking on wet floors. Keep items that you use a lot in easy-to-reach places. If you need to reach something above you, use a strong step stool that has a grab bar. Keep electrical cords out of the way. Do not use floor polish or wax that makes floors slippery. If  you must use wax, use non-skid floor wax. Do not have throw rugs and other things on the floor that can make you trip. What can I do with my stairs? Do not leave any items on the stairs. Make sure that there are handrails on both sides of the stairs and use them. Fix handrails that are broken or loose. Make sure that handrails are as long as the stairways. Check any carpeting to make sure that it is firmly attached to the stairs. Fix any carpet that is loose or worn. Avoid having throw rugs at the top or bottom of the stairs. If you do have throw rugs, attach them to the floor with carpet tape. Make sure that you have a light switch at the top of the stairs and the bottom of the stairs. If you do not have them, ask someone to add them for you. What else can I do to help prevent falls? Wear shoes that: Do not have high heels. Have rubber bottoms. Are comfortable  and fit you well. Are closed at the toe. Do not wear sandals. If you use a stepladder: Make sure that it is fully opened. Do not climb a closed stepladder. Make sure that both sides of the stepladder are locked into place. Ask someone to hold it for you, if possible. Clearly mark and make sure that you can see: Any grab bars or handrails. First and last steps. Where the edge of each step is. Use tools that help you move around (mobility aids) if they are needed. These include: Canes. Walkers. Scooters. Crutches. Turn on the lights when you go into a dark area. Replace any light bulbs as soon as they burn out. Set up your furniture so you have a clear path. Avoid moving your furniture around. If any of your floors are uneven, fix them. If there are any pets around you, be aware of where they are. Review your medicines with your doctor. Some medicines can make you feel dizzy. This can increase your chance of falling. Ask your doctor what other things that you can do to help prevent falls. This information is not intended to replace advice given to you by your health care provider. Make sure you discuss any questions you have with your health care provider. Document Released: 02/02/2009 Document Revised: 09/14/2015 Document Reviewed: 05/13/2014 Elsevier Interactive Patient Education  2017 [employer].

## 2022-10-15 NOTE — Telephone Encounter (Signed)
They already have the prescription for a Z-pak but he is not taking it. They are wanting to know if you think he should go through with his procedure on Thursday with Urology since he has this cough and is taking Prednisone. Or should he reschedule the procedure.

## 2022-10-17 ENCOUNTER — Ambulatory Visit (HOSPITAL_BASED_OUTPATIENT_CLINIC_OR_DEPARTMENT_OTHER)
Admission: RE | Admit: 2022-10-17 | Discharge: 2022-10-17 | Disposition: A | Discharge status: Home / Self Care | Payer: Medicare HMO | Attending: Urology | Admitting: Urology

## 2022-10-17 ENCOUNTER — Ambulatory Visit (HOSPITAL_BASED_OUTPATIENT_CLINIC_OR_DEPARTMENT_OTHER): Payer: Medicare HMO | Admitting: Anesthesiology

## 2022-10-17 ENCOUNTER — Encounter (HOSPITAL_BASED_OUTPATIENT_CLINIC_OR_DEPARTMENT_OTHER): Payer: Self-pay | Admitting: Urology

## 2022-10-17 ENCOUNTER — Other Ambulatory Visit: Payer: Self-pay

## 2022-10-17 ENCOUNTER — Encounter (HOSPITAL_BASED_OUTPATIENT_CLINIC_OR_DEPARTMENT_OTHER)
Admission: RE | Disposition: A | Discharge status: Home / Self Care | Payer: Self-pay | Source: Home / Self Care | Attending: Urology

## 2022-10-17 DIAGNOSIS — C61 Malignant neoplasm of prostate: Secondary | ICD-10-CM | POA: Insufficient documentation

## 2022-10-17 DIAGNOSIS — G4730 Sleep apnea, unspecified: Secondary | ICD-10-CM

## 2022-10-17 DIAGNOSIS — J449 Chronic obstructive pulmonary disease, unspecified: Secondary | ICD-10-CM | POA: Diagnosis not present

## 2022-10-17 DIAGNOSIS — Z87891 Personal history of nicotine dependence: Secondary | ICD-10-CM

## 2022-10-17 DIAGNOSIS — Z01818 Encounter for other preprocedural examination: Secondary | ICD-10-CM

## 2022-10-17 DIAGNOSIS — C7982 Secondary malignant neoplasm of genital organs: Secondary | ICD-10-CM | POA: Diagnosis not present

## 2022-10-17 DIAGNOSIS — G4733 Obstructive sleep apnea (adult) (pediatric): Secondary | ICD-10-CM | POA: Insufficient documentation

## 2022-10-17 HISTORY — DX: Other seasonal allergic rhinitis: J30.2

## 2022-10-17 HISTORY — DX: Exudative age-related macular degeneration, left eye, stage unspecified: H35.3220

## 2022-10-17 HISTORY — DX: Unqualified visual loss, left eye, normal vision right eye: H54.62

## 2022-10-17 HISTORY — PX: ORCHIECTOMY: SHX2116

## 2022-10-17 HISTORY — DX: Unspecified macular degeneration: H35.30

## 2022-10-17 HISTORY — DX: Unspecified hearing loss, unspecified ear: H91.90

## 2022-10-17 HISTORY — DX: Other forms of dyspnea: R06.09

## 2022-10-17 HISTORY — DX: Nonexudative age-related macular degeneration, right eye, stage unspecified: H35.3110

## 2022-10-17 HISTORY — DX: Prediabetes: R73.03

## 2022-10-17 HISTORY — DX: Hyperlipidemia, unspecified: E78.5

## 2022-10-17 HISTORY — DX: Gastro-esophageal reflux disease without esophagitis: K21.9

## 2022-10-17 HISTORY — DX: Ventricular premature depolarization: I49.3

## 2022-10-17 HISTORY — DX: Ulcerative colitis, unspecified, without complications: K51.90

## 2022-10-17 HISTORY — DX: Nocturia: R35.1

## 2022-10-17 HISTORY — DX: Diaphragmatic hernia without obstruction or gangrene: K44.9

## 2022-10-17 SURGERY — ORCHIECTOMY
Anesthesia: General | Laterality: Bilateral

## 2022-10-17 MED ORDER — TRAMADOL HCL 50 MG PO TABS: ORAL_TABLET | ORAL | 0 refills | Status: DC

## 2022-10-17 MED ORDER — CEFAZOLIN SODIUM-DEXTROSE 2-4 GM/100ML-% IV SOLN
INTRAVENOUS | Status: AC
  Filled 2022-10-17: qty 100

## 2022-10-17 MED ORDER — PROPOFOL 10 MG/ML IV BOLUS
INTRAVENOUS | Status: DC | PRN
  Administered 2022-10-17: 110 mg via INTRAVENOUS

## 2022-10-17 MED ORDER — LIDOCAINE 2% (20 MG/ML) 5 ML SYRINGE
INTRAMUSCULAR | Status: DC | PRN
  Administered 2022-10-17: 30 mg via INTRAVENOUS

## 2022-10-17 MED ORDER — 0.9 % SODIUM CHLORIDE (POUR BTL) OPTIME
TOPICAL | Status: DC | PRN
  Administered 2022-10-17: 500 mL

## 2022-10-17 MED ORDER — ONDANSETRON HCL 4 MG/2ML IJ SOLN
INTRAMUSCULAR | Status: DC | PRN
  Administered 2022-10-17: 4 mg via INTRAVENOUS

## 2022-10-17 MED ORDER — LACTATED RINGERS IV SOLN: INTRAVENOUS | Status: DC

## 2022-10-17 MED ORDER — DEXAMETHASONE SODIUM PHOSPHATE 10 MG/ML IJ SOLN
INTRAMUSCULAR | Status: DC | PRN
  Administered 2022-10-17: 5 mg via INTRAVENOUS

## 2022-10-17 MED ORDER — FENTANYL CITRATE (PF) 100 MCG/2ML IJ SOLN
INTRAMUSCULAR | Status: DC | PRN
  Administered 2022-10-17: 50 ug via INTRAVENOUS
  Administered 2022-10-17 (×2): 25 ug via INTRAVENOUS

## 2022-10-17 MED ORDER — BUPIVACAINE HCL (PF) 0.25 % IJ SOLN
INTRAMUSCULAR | Status: DC | PRN
  Administered 2022-10-17: 20 mL

## 2022-10-17 MED ORDER — CEFAZOLIN SODIUM-DEXTROSE 2-4 GM/100ML-% IV SOLN
2.0000 g | INTRAVENOUS | Status: AC
  Administered 2022-10-17: 2 g via INTRAVENOUS

## 2022-10-17 MED ORDER — FENTANYL CITRATE (PF) 100 MCG/2ML IJ SOLN
INTRAMUSCULAR | Status: AC
  Filled 2022-10-17: qty 2

## 2022-10-17 MED ORDER — ACETAMINOPHEN 500 MG PO TABS
1000.0000 mg | ORAL_TABLET | Freq: Once | ORAL | Status: AC
  Administered 2022-10-17: 1000 mg via ORAL

## 2022-10-17 MED ORDER — FENTANYL CITRATE (PF) 100 MCG/2ML IJ SOLN: 25.0000 ug | INTRAMUSCULAR | Status: DC | PRN

## 2022-10-17 MED ORDER — ACETAMINOPHEN 500 MG PO TABS
ORAL_TABLET | ORAL | Status: AC
  Filled 2022-10-17: qty 2

## 2022-10-17 SURGICAL SUPPLY — 43 items
ADH SKN CLS APL DERMABOND .7 (GAUZE/BANDAGES/DRESSINGS) ×1
BLADE CLIPPER SENSICLIP SURGIC (BLADE) IMPLANT
BLADE SURG 15 STRL LF DISP TIS (BLADE) ×1 IMPLANT
BLADE SURG 15 STRL SS (BLADE) ×1
BLANKET WARM UPPER BOD BAIR (MISCELLANEOUS) ×1 IMPLANT
BNDG GAUZE DERMACEA FLUFF 4 (GAUZE/BANDAGES/DRESSINGS) ×1 IMPLANT
BNDG GZE DERMACEA 4 6PLY (GAUZE/BANDAGES/DRESSINGS) ×1
COVER BACK TABLE 60X90IN (DRAPES) ×1 IMPLANT
COVER MAYO STAND STRL (DRAPES) ×1 IMPLANT
DERMABOND ADVANCED .7 DNX12 (GAUZE/BANDAGES/DRESSINGS) ×1 IMPLANT
DISSECTOR ROUND CHERRY 3/8 STR (MISCELLANEOUS) IMPLANT
DRAPE LAPAROTOMY 100X72 PEDS (DRAPES) ×1 IMPLANT
ELECT REM PT RETURN 9FT ADLT (ELECTROSURGICAL) ×1
ELECTRODE REM PT RTRN 9FT ADLT (ELECTROSURGICAL) ×1 IMPLANT
GAUZE 4X4 16PLY ~~LOC~~+RFID DBL (SPONGE) ×1 IMPLANT
GAUZE SPONGE 4X4 12PLY STRL (GAUZE/BANDAGES/DRESSINGS) IMPLANT
GLOVE BIO SURGEON STRL SZ8 (GLOVE) ×1 IMPLANT
GOWN STRL REUS W/TWL XL LVL3 (GOWN DISPOSABLE) ×1 IMPLANT
KIT TURNOVER CYSTO (KITS) ×1 IMPLANT
NDL HYPO 25X1 1.5 SAFETY (NEEDLE) ×1 IMPLANT
NEEDLE HYPO 25X1 1.5 SAFETY (NEEDLE) ×1 IMPLANT
NS IRRIG 500ML POUR BTL (IV SOLUTION) ×1 IMPLANT
PACK BASIN DAY SURGERY FS (CUSTOM PROCEDURE TRAY) ×1 IMPLANT
PENCIL SMOKE EVACUATOR (MISCELLANEOUS) ×1 IMPLANT
SET COLLECT BLD 21X.75 12 PB G (NEEDLE) IMPLANT
SLEEVE SCD COMPRESS KNEE MED (STOCKING) ×1 IMPLANT
SUPPORT SCROTAL LG STRP (MISCELLANEOUS) ×1 IMPLANT
SUPPORT SCROTAL MED ADLT STRP (MISCELLANEOUS) IMPLANT
SUT MNCRL AB 4-0 PS2 18 (SUTURE) ×1 IMPLANT
SUT PROLENE 4 0 RB 1 (SUTURE)
SUT PROLENE 4-0 RB1 .5 CRCL 36 (SUTURE) IMPLANT
SUT SILK 0 TIES 10X30 (SUTURE) ×1 IMPLANT
SUT VIC AB 3-0 SH 27 (SUTURE) ×1
SUT VIC AB 3-0 SH 27X BRD (SUTURE) ×1 IMPLANT
SUT VIC AB 4-0 SH 27 (SUTURE)
SUT VIC AB 4-0 SH 27XANBCTRL (SUTURE) IMPLANT
SYR BULB IRRIG 60ML STRL (SYRINGE) IMPLANT
SYR CONTROL 10ML LL (SYRINGE) ×1 IMPLANT
TOWEL OR 17X24 6PK STRL BLUE (TOWEL DISPOSABLE) ×2 IMPLANT
TRAY DSU PREP LF (CUSTOM PROCEDURE TRAY) ×1 IMPLANT
TUBE CONNECTING 12X1/4 (SUCTIONS) ×1 IMPLANT
WATER STERILE IRR 500ML POUR (IV SOLUTION) IMPLANT
YANKAUER SUCT BULB TIP NO VENT (SUCTIONS) ×1 IMPLANT

## 2022-10-17 NOTE — Transfer of Care (Signed)
Immediate Anesthesia Transfer of Care Note  Patient: Brian Hopkins  Procedure(s) Performed: Procedure(s) (LRB): SIMPLE ORCHIECTOMY (Bilateral)  Patient Location: PACU  Anesthesia Type: General  Level of Consciousness: awake, oriented, sedated and patient cooperative  Airway & Oxygen Therapy: Patient Spontanous Breathing and Patient connected to face mask oxygen  Post-op Assessment: Report given to PACU RN and Post -op Vital signs reviewed and stable  Post vital signs: Reviewed and stable  Complications: No apparent anesthesia complications Last Vitals:  Vitals Value Taken Time  BP 152/85 10/17/22 1230  Temp 36.2 C 10/17/22 1227  Pulse 72 10/17/22 1231  Resp 18 10/17/22 1231  SpO2 97 % 10/17/22 1231  Vitals shown include unvalidated device data.  Last Pain:  Vitals:   10/17/22 0952  TempSrc: Oral  PainSc: 0-No pain      Patients Stated Pain Goal: 5 (10/17/22 4098)  Complications: No notable events documented.

## 2022-10-17 NOTE — Anesthesia Preprocedure Evaluation (Addendum)
Anesthesia Evaluation  Patient identified by MRN, date of birth, ID band Patient awake    Reviewed: Allergy & Precautions, NPO status , Patient's Chart, lab work & pertinent test results  Airway Mallampati: I  TM Distance: >3 FB Neck ROM: Full    Dental  (+) Teeth Intact, Dental Advisory Given   Pulmonary sleep apnea , COPD, former smoker   breath sounds clear to auscultation       Cardiovascular  Rhythm:Regular Rate:Normal     Neuro/Psych  negative psych ROS   GI/Hepatic Neg liver ROS, hiatal hernia,GERD  ,,  Endo/Other  negative endocrine ROS    Renal/GU negative Renal ROS     Musculoskeletal negative musculoskeletal ROS (+)    Abdominal   Peds  Hematology negative hematology ROS (+)   Anesthesia Other Findings   Reproductive/Obstetrics                             Anesthesia Physical Anesthesia Plan  ASA: 3  Anesthesia Plan: General   Post-op Pain Management: Tylenol PO (pre-op)*   Induction: Intravenous  PONV Risk Score and Plan: 3 and Ondansetron and Treatment may vary due to age or medical condition  Airway Management Planned: LMA  Additional Equipment: None  Intra-op Plan:   Post-operative Plan: Extubation in OR  Informed Consent: I have reviewed the patients History and Physical, chart, labs and discussed the procedure including the risks, benefits and alternatives for the proposed anesthesia with the patient or authorized representative who has indicated his/her understanding and acceptance.       Plan Discussed with: CRNA  Anesthesia Plan Comments:        Anesthesia Quick Evaluation

## 2022-10-17 NOTE — Anesthesia Procedure Notes (Signed)
Procedure Name: LMA Insertion Date/Time: 10/17/2022 11:36 AM  Performed by: Francie Massing, CRNAPre-anesthesia Checklist: Patient identified, Emergency Drugs available, Suction available and Patient being monitored Patient Re-evaluated:Patient Re-evaluated prior to induction Oxygen Delivery Method: Circle system utilized Preoxygenation: Pre-oxygenation with 100% oxygen Induction Type: IV induction Ventilation: Mask ventilation without difficulty LMA: LMA inserted LMA Size: 4.0 Number of attempts: 1 Airway Equipment and Method: Bite block Placement Confirmation: positive ETCO2 Tube secured with: Tape Dental Injury: Teeth and Oropharynx as per pre-operative assessment

## 2022-10-17 NOTE — H&P (Signed)
H&P  Chief Complaint: PCa  History of Present Illness: 89 yo male presents for (B) orchiectomy. His Hx is as follows:  TRUS/Bx 4.19.2024: Prostate volume 67.5 mL, PSA 47, PSAD 0.70  6/12 cores positive--  1 core (Lt apex medial) revealed GS 4+3 pattern in 20% of core (PNI present)  4 cores (Lt mid lateral, Rt base medial, Rt base lateral, Rt apex lateral) revealed GS 4+4 pattern in 20,20,30 and 5% of cores, respectively.  1 core (Lt mid lateral) revealed GS 4+5 pattern in 50% of core   5.16.2024: PSMA PET scan--  Intense radio tracer uptake in Rt prostate w/ asymmetric bulging and SV involvement on that side.  No evidence of extraprostatic uptake.    Past Medical History:  Diagnosis Date   Age-related macular degeneration, dry, right eye    Age-related macular degeneration, wet, left eye (HCC)    10-09-2022  per pt eye injection's every 8 weeks   COPD (chronic obstructive pulmonary disease) (HCC)    pulmnologist--- dr Army Chaco;  mild copd w/ emphysema;   does not have oxygen   Decreased vision of left eye    DOE (dyspnea on exertion)    Dyslipidemia    GERD (gastroesophageal reflux disease)    Hiatal hernia    History of ulcerative colitis 2010   (10-09-2022  per pt has not had issues in several years)  previously followed by [facility] last seen 2017 (office note in care everywhere) -- left sided UC  in clinical remission ,  dx 2010 ,   History of vertebral compression fracture 2022   T8   HOH (hard of hearing)    left > right   Malignant neoplasm prostate (HCC) 07/2022   urologist--- dr Retta Diones;  bx 04/ 2024, Gleason 4+3   Nocturia    OSA (obstructive sleep apnea) 2017   pulmonologist-- dr Erin Fulling;   stopped using cpap   PVC's (premature ventricular contractions)    Seasonal allergies     Past Surgical History:  Procedure Laterality Date   APPENDECTOMY  1957   open   CATARACT EXTRACTION W/ INTRAOCULAR LENS IMPLANT Right 2019   CHOLECYSTECTOMY OPEN  1985    COLONOSCOPY  2015   KNEE SURGERY Left 1952   per pt tumor removed, benign   TONSILLECTOMY     child    Home Medications:  Allergies as of 10/17/2022       Reactions   Statins Other (See Comments)   myalgias   Augmentin [amoxicillin-pot Clavulanate] Rash   Sulfa Antibiotics Rash        Medication List      Notice   Cannot display discharge medications because the patient has not yet been admitted.     Allergies:  Allergies  Allergen Reactions   Statins Other (See Comments)    myalgias   Augmentin [Amoxicillin-Pot Clavulanate] Rash   Sulfa Antibiotics Rash    History reviewed. No pertinent family history.  Social History:  reports that he quit smoking about 70 years ago. His smoking use included cigarettes. He has never used smokeless tobacco. He reports that he does not currently use alcohol. He reports that he does not use drugs.  ROS: A complete review of systems was performed.  All systems are negative except for pertinent findings as noted.  Physical Exam:  Vital signs in last 24 hours: Ht 5\' 10"  (1.778 m)   Wt 96.6 kg   BMI 30.56 kg/m  Constitutional:  Alert and oriented, No  acute distress Cardiovascular: Regular rate  Respiratory: Normal respiratory effort Psychiatric: Normal mood and affect  I have reviewed prior pt notes  I have reviewed urinalysis results  I have independently reviewed prior imaging  I have reviewed prior PSA results    Impression/Assessment:  GG 5 PCa in an 89 yo male  Plan:  (B) simple orchiectomy

## 2022-10-17 NOTE — Discharge Instructions (Addendum)
HOME CARE INSTRUCTIONS FOR SCROTAL PROCEDURES  Wound Care & Hygiene: You may apply an ice bag to the scrotum for the first 24 hours.  This may help decrease swelling and soreness.  You may have a dressing held in place by an athletic supporter.  You may remove the dressing in 24 hours and shower in 48 hours.  Continue to use the athletic supporter or tight briefs for at least a week. Activity: Rest today - not necessarily flat bed rest.  Just take it easy.  You should not do strenuous activities until your follow-up visit with your doctor.  You may resume light activity in 48 hours.  Return to Work:  Your doctor will advise you of this depending on the type of work you do  Diet: Drink liquids or eat a light diet this evening.  You may resume a regular diet tomorrow.  General Expectations: You may have a small amount of bleeding.  The scrotum may be swollen or bruised for about a week.  Call your Doctor if these occur:  -persistent or heavy bleeding  -temperature of 101 degrees or more  -severe pain, not relieved by your pain medication  Call to set up and appointment.   Post Anesthesia Home Care Instructions  Activity: Get plenty of rest for the remainder of the day. A responsible individual must stay with you for 24 hours following the procedure.  For the next 24 hours, DO NOT: -Drive a car -Advertising copywriter -Drink alcoholic beverages -Take any medication unless instructed by your physician -Make any legal decisions or sign important papers.  Meals: Start with liquid foods such as gelatin or soup. Progress to regular foods as tolerated. Avoid greasy, spicy, heavy foods. If nausea and/or vomiting occur, drink only clear liquids until the nausea and/or vomiting subsides. Call your physician if vomiting continues.  Special Instructions/Symptoms: Your throat may feel dry or sore from the anesthesia or the breathing tube placed in your throat during surgery. If this causes  discomfort, gargle with warm salt water. The discomfort should disappear within 24 hours.

## 2022-10-17 NOTE — Op Note (Signed)
Diagnosis: High risk prostate cancer with local extension  Postoperative diagnosis: Same  Principal procedure: Bilateral simple orchiectomy  Surgeon: Teiana Hajduk  Assistant: Gordan Payment, MD  Anesthesia: General with LMA  Complications: None  Estimated blood loss: Less than 5 mL  Specimen: Bilateral testicles, for gross only  Indications: 89 year old male with very high PSA.  Really too old for screening but he presented with his PSA and a nodular prostate.  He underwent ultrasound biopsy of his prostate revealing fairly high-volume GS 4+5 adenocarcinoma.  PET scan revealed extension into the seminal vesicles.  Due to the fact that he is 9, has high risk prostate cancer with local extension, I recommended androgen deprivation therapy.  We talked about injections, oral medications as well as orchiectomy.  The latter of these is quite simple for him, and less expensive.  He would like to go with this method.  Description of procedure: Patient properly identified in the holding area.  He received preoperative IV antibiotics.  Taken the operating room where general anesthetic was administered with the LMA.  Genitalia and perineum were prepped, draped, proper timeout performed.  2 cm incision made in the anterior superior scrotum along the median raphae.  Carried down to the right tunica albuginea with electrocautery.  Blunt dissection of the cord skeletonizing it.  Cord was then clamped and 2 packets with Kelly clamps.  The testicle and distal cord were then removed.  Using 0 silk ties, each segment was doubly ligated.  5 cc of Marcaine placed in the cord for block.  The same procedure was done on the opposite side.  Again, local cord block performed with the same quarter percent plain Marcaine.  Hemostasis on each side was excellent.  Dartos fascia was closed using a running 3-0 Vicryl.  Skin edges were reapproximated using a 4-0 Monocryl placed in simple running fashion.  Dermabond, fluffs and a  jockstrap placed.  At this point the procedure was terminated.  The patient was awakened, taken to the PACU in stable condition having tolerated the procedure well.

## 2022-10-17 NOTE — Interval H&P Note (Signed)
History and Physical Interval Note:  10/17/2022 11:20 AM  Brian Hopkins  has presented today for surgery, with the diagnosis of PROSTATE CANCER.  The various methods of treatment have been discussed with the patient and family. After consideration of risks, benefits and other options for treatment, the patient has consented to  Procedure(s) with comments: SIMPLE ORCHIECTOMY (Bilateral) - 30 MINS as a surgical intervention.  The patient's history has been reviewed, patient examined, no change in status, stable for surgery.  I have reviewed the patient's chart and labs.  Questions were answered to the patient's satisfaction.     Bertram Millard Reneisha Stilley

## 2022-10-17 NOTE — Anesthesia Postprocedure Evaluation (Signed)
Anesthesia Post Note  Patient: Brian Hopkins  Procedure(s) Performed: SIMPLE ORCHIECTOMY (Bilateral)     Patient location during evaluation: PACU Anesthesia Type: General Level of consciousness: awake and alert Pain management: pain level controlled Vital Signs Assessment: post-procedure vital signs reviewed and stable Respiratory status: spontaneous breathing, nonlabored ventilation, respiratory function stable and patient connected to nasal cannula oxygen Cardiovascular status: blood pressure returned to baseline and stable Postop Assessment: no apparent nausea or vomiting Anesthetic complications: no  No notable events documented.  Last Vitals:  Vitals:   10/17/22 1245 10/17/22 1300  BP: (!) 154/79 (!) 154/95  Pulse: (!) 58 (!) 57  Resp: (!) 9 12  Temp:    SpO2: 94% 98%    Last Pain:  Vitals:   10/17/22 1300  TempSrc:   PainSc: 0-No pain                 Shelton Silvas

## 2022-10-18 ENCOUNTER — Encounter (HOSPITAL_BASED_OUTPATIENT_CLINIC_OR_DEPARTMENT_OTHER): Payer: Self-pay | Admitting: Urology

## 2022-10-21 LAB — SURGICAL PATHOLOGY, GROSS ONLY (NOT ARMC)

## 2022-11-04 DIAGNOSIS — C61 Malignant neoplasm of prostate: Secondary | ICD-10-CM | POA: Diagnosis not present

## 2022-11-30 ENCOUNTER — Other Ambulatory Visit: Payer: Self-pay | Admitting: Emergency Medicine

## 2022-12-27 IMAGING — DX DG CHEST 1V PORT
1 series · 1 of 1 positions shown · non-contrast
Comparison: None.

CLINICAL DATA: Shortness of breath, history of COPD.

EXAM:
PORTABLE CHEST 1 VIEW

[chest ap]
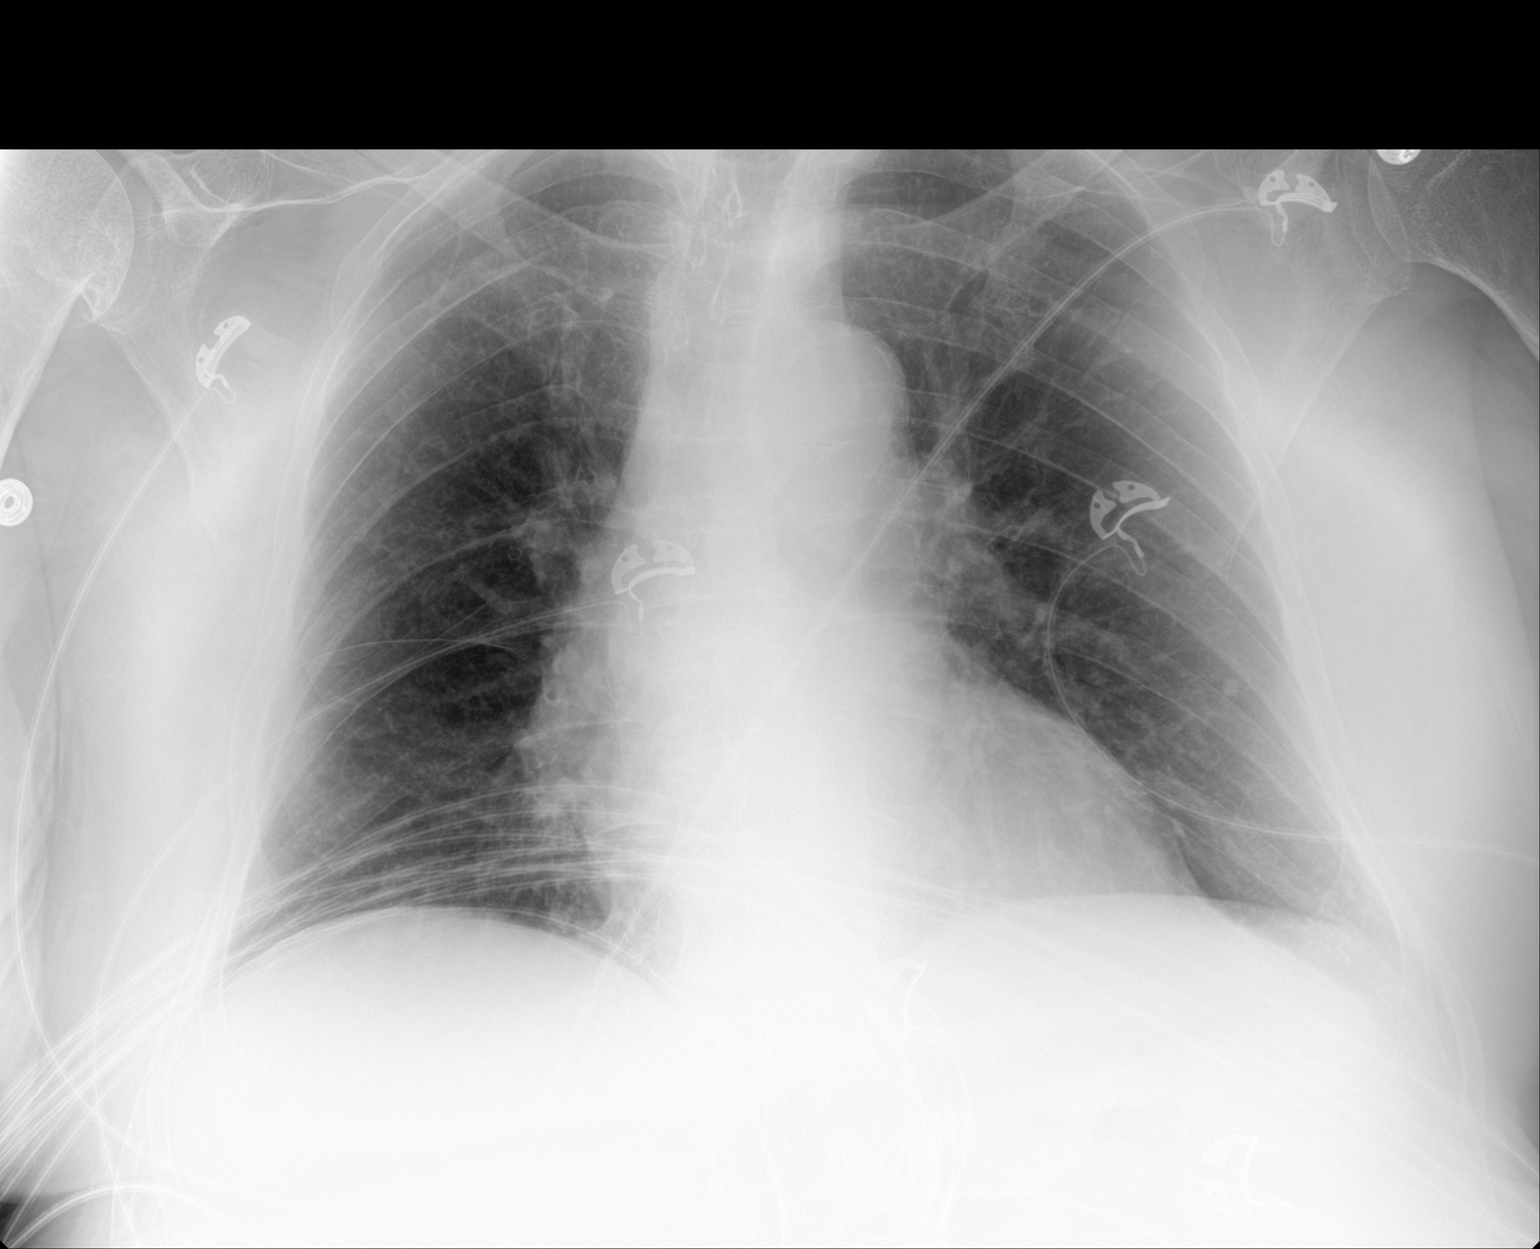

[1 of 1 positions shown; findings below may reference images not displayed]

FINDINGS: The heart size and mediastinal contours are within normal limits.
Both lungs are clear without evidence of focal consolidation or
pleural effusion. Hyperinflated lungs concerning for COPD. The
visualized skeletal structures are unremarkable.
IMPRESSION: No active disease.

## 2022-12-31 ENCOUNTER — Other Ambulatory Visit: Payer: Self-pay | Admitting: Pulmonary Disease

## 2022-12-31 ENCOUNTER — Other Ambulatory Visit: Payer: Self-pay | Admitting: Internal Medicine

## 2022-12-31 ENCOUNTER — Other Ambulatory Visit: Payer: Self-pay | Admitting: Emergency Medicine

## 2022-12-31 DIAGNOSIS — J449 Chronic obstructive pulmonary disease, unspecified: Secondary | ICD-10-CM

## 2022-12-31 DIAGNOSIS — G4719 Other hypersomnia: Secondary | ICD-10-CM

## 2022-12-31 NOTE — Telephone Encounter (Signed)
Dr. Belia Heman, please advise if okay to order ventolin. Rx not mentioned in last OV.

## 2023-01-02 DIAGNOSIS — H35033 Hypertensive retinopathy, bilateral: Secondary | ICD-10-CM | POA: Diagnosis not present

## 2023-01-02 DIAGNOSIS — H43393 Other vitreous opacities, bilateral: Secondary | ICD-10-CM | POA: Diagnosis not present

## 2023-01-02 DIAGNOSIS — H353211 Exudative age-related macular degeneration, right eye, with active choroidal neovascularization: Secondary | ICD-10-CM | POA: Diagnosis not present

## 2023-01-02 DIAGNOSIS — H353223 Exudative age-related macular degeneration, left eye, with inactive scar: Secondary | ICD-10-CM | POA: Diagnosis not present

## 2023-01-02 DIAGNOSIS — H43813 Vitreous degeneration, bilateral: Secondary | ICD-10-CM | POA: Diagnosis not present

## 2023-01-03 ENCOUNTER — Encounter: Payer: Self-pay | Admitting: Emergency Medicine

## 2023-01-08 ENCOUNTER — Encounter: Payer: Self-pay | Admitting: Internal Medicine

## 2023-01-08 ENCOUNTER — Encounter: Payer: Self-pay | Admitting: Emergency Medicine

## 2023-01-09 ENCOUNTER — Encounter: Payer: Self-pay | Admitting: Emergency Medicine

## 2023-01-12 ENCOUNTER — Encounter: Payer: Self-pay | Admitting: Internal Medicine

## 2023-01-12 ENCOUNTER — Other Ambulatory Visit: Payer: Self-pay | Admitting: Internal Medicine

## 2023-01-12 DIAGNOSIS — J449 Chronic obstructive pulmonary disease, unspecified: Secondary | ICD-10-CM

## 2023-01-13 NOTE — Telephone Encounter (Signed)
PA team, please start PA for Anoro.

## 2023-01-15 ENCOUNTER — Encounter: Payer: Self-pay | Admitting: Internal Medicine

## 2023-01-15 ENCOUNTER — Encounter: Payer: Self-pay | Admitting: Emergency Medicine

## 2023-01-17 ENCOUNTER — Other Ambulatory Visit (HOSPITAL_COMMUNITY): Payer: Self-pay

## 2023-01-17 NOTE — Telephone Encounter (Signed)
Patient filled/picked up Anoro on 01-13-2023 for a 90 day supply and is too soon to fill

## 2023-01-19 ENCOUNTER — Encounter: Payer: Self-pay | Admitting: Emergency Medicine

## 2023-01-21 ENCOUNTER — Ambulatory Visit: Payer: Medicare HMO | Admitting: Emergency Medicine

## 2023-01-29 ENCOUNTER — Encounter: Payer: Self-pay | Admitting: Nurse Practitioner

## 2023-01-29 ENCOUNTER — Ambulatory Visit: Payer: Medicare HMO | Admitting: Nurse Practitioner

## 2023-01-29 VITALS — BP 114/70 | HR 77 | Temp 97.6°F | Ht 70.0 in | Wt 203.0 lb

## 2023-01-29 DIAGNOSIS — J449 Chronic obstructive pulmonary disease, unspecified: Secondary | ICD-10-CM | POA: Diagnosis not present

## 2023-01-29 DIAGNOSIS — J300 Vasomotor rhinitis: Secondary | ICD-10-CM

## 2023-01-29 MED ORDER — IPRATROPIUM BROMIDE 0.06 % NA SOLN
2.0000 | Freq: Four times a day (QID) | NASAL | 12 refills | Status: AC
Start: 2023-01-29 — End: ?

## 2023-01-29 MED ORDER — PREDNISONE 10 MG PO TABS
ORAL_TABLET | ORAL | 0 refills | Status: DC
Start: 2023-01-29 — End: 2023-03-17

## 2023-01-29 NOTE — Progress Notes (Unsigned)
@Patient  ID: Brian Hopkins, male    DOB: 1931-08-16, 89 y.o.   MRN: [id number]  Chief Complaint  Patient presents with   Follow-up    Referring provider: Georgina Quint, *  HPI:   TEST/EVENTS:   01/29/2023: Today - follow up Patient presents today for follow-up with his daughter.  Has been doing okay since he was last seen.  He did start having some increased cough a little over a week ago.  His daughter started him on a prednisone pack that they had on hand.  He is feeling better today.  Feels like he is getting back to his baseline.  Occasionally produces some clear phlegm.  Allergies  Allergen Reactions   Statins Other (See Comments)    myalgias   Augmentin [Amoxicillin-Pot Clavulanate] Rash   Sulfa Antibiotics Rash    Immunization History  Administered Date(s) Administered   Fluad Quad(high Dose 65+) 01/14/2022   Fluad Trivalent(High Dose 65+) 01/04/2023   H1N1 12/12/2014   Influenza Whole 04/22/2005, 01/23/2007   Influenza, High Dose Seasonal PF 01/04/2014, 12/20/2016, 12/25/2017, 12/10/2018, 12/10/2018   Influenza-Unspecified 01/08/2009, 12/22/2012   Moderna Sars-Covid-2 Vaccination 01/04/2023   PFIZER Comirnaty(Gray Top)Covid-19 Tri-Sucrose Vaccine 05/28/2019, 06/19/2019, 01/26/2020, 08/17/2020   Pneumococcal Conjugate-13 08/23/2013, 08/24/2013, 05/17/2016   Pneumococcal Polysaccharide-23 04/22/2000, 12/15/2005   Respiratory Syncytial Virus Vaccine,Recomb Aduvanted(Arexvy) 02/27/2022   Td 04/22/1996, 01/18/2007   Td (Adult),5 Lf Tetanus Toxid, Preservative Free 04/22/1996, 01/18/2007   Tdap 04/22/1996, 12/15/2005, 01/18/2007, 10/25/2017, 10/25/2018, 01/14/2023   Zoster Recombinant(Shingrix) 07/28/2017, 10/24/2017, 10/25/2017   Zoster, Live 11/14/2010    Past Medical History:  Diagnosis Date   Age-related macular degeneration, dry, right eye    Age-related macular degeneration, wet, left eye (HCC)    10-09-2022  per pt eye injection's every 8 weeks    COPD (chronic obstructive pulmonary disease) (HCC)    pulmnologist--- dr Army Chaco;  mild copd w/ emphysema;   does not have oxygen   Decreased vision of left eye    DOE (dyspnea on exertion)    Dyslipidemia    GERD (gastroesophageal reflux disease)    Hiatal hernia    History of ulcerative colitis 2010   (10-09-2022  per pt has not had issues in several years)  previously followed by [facility] last seen 2017 (office note in care everywhere) -- left sided UC  in clinical remission ,  dx 2010 ,   History of vertebral compression fracture 2022   T8   HOH (hard of hearing)    left > right   Malignant neoplasm prostate (HCC) 07/2022   urologist--- dr Retta Diones;  bx 04/ 2024, Gleason 4+3   Nocturia    OSA (obstructive sleep apnea) 2017   pulmonologist-- dr Erin Fulling;   stopped using cpap   PVC's (premature ventricular contractions)    Seasonal allergies     Tobacco History: Social History   Tobacco Use  Smoking Status Former   Current packs/day: 0.00   Types: Cigarettes   Start date: 1952   Quit date: 1954   Years since quitting: 70.8  Smokeless Tobacco Never   Counseling given: Not Answered   Outpatient Medications Prior to Visit  Medication Sig Dispense Refill   acetaminophen (TYLENOL) 650 MG CR tablet Take 1,300 mg by mouth every 8 (eight) hours as needed for pain.     albuterol (VENTOLIN HFA) 108 (90 Base) MCG/ACT inhaler INHALE 2 PUFFS INTO THE LUNGS EVERY 4 (FOUR) HOURS AS NEEDED FOR SHORTNESS OF BREATH 54 each  12   aluminum hydroxide-magnesium carbonate (GAVISCON) 95-358 MG/15ML SUSP Take by mouth as needed for heartburn or indigestion.     ANORO ELLIPTA 62.5-25 MCG/ACT AEPB TAKE 1 PUFF BY MOUTH EVERY DAY 180 each 10   azelastine (ASTELIN) 0.1 % nasal spray Place 2 sprays into both nostrils 2 (two) times daily. (Patient taking differently: Place 2 sprays into both nostrils 2 (two) times daily as needed for rhinitis or allergies.) 30 mL 10   azithromycin (ZITHROMAX) 250 MG  tablet Take 2 tablets (500 mg) on  Day 1,  followed by 1 tablet (250 mg) once daily on Days 2 through 5. 6 each 0   clonazePAM (KLONOPIN) 0.5 MG tablet Take 1 tablet (0.5 mg total) by mouth daily as needed for anxiety. 20 tablet 1   fluticasone (FLONASE) 50 MCG/ACT nasal spray SPRAY 1 SPRAY INTO BOTH NOSTRILS DAILY. (Patient taking differently: Place 1 spray into both nostrils at bedtime.) 48 mL 3   Fluticasone Furoate (ARNUITY ELLIPTA) 200 MCG/ACT AEPB Inhale 1 puff into the lungs daily. (Patient taking differently: Inhale 1 puff into the lungs daily as needed.) 30 each 5   hydrOXYzine (ATARAX) 25 MG tablet TAKE 1 TABLET BY MOUTH EVERY 8 HOURS AS NEEDED FOR ANXIETY 30 tablet 1   Ibuprofen-Acetaminophen (ADVIL DUAL ACTION) 125-250 MG TABS Take 2 tablets by mouth every 8 (eight) hours as needed.     ipratropium-albuterol (DUONEB) 0.5-2.5 (3) MG/3ML SOLN Inhale 3 mLs into the lungs every 6 (six) hours as needed. 360 mL 12   magnesium hydroxide (MILK OF MAGNESIA) 400 MG/5ML suspension Take by mouth daily as needed for mild constipation.     Multiple Vitamin (MULTIVITAMIN) capsule Take 1 capsule by mouth daily.     Polyvinyl Alcohol-Povidone (REFRESH OP) Apply to eye as needed.     predniSONE (DELTASONE) 20 MG tablet Take 1 tablet (20 mg total) by mouth daily with breakfast. 10 days 10 tablet 5   traMADol (ULTRAM) 50 MG tablet 1 tab po q 4-6 hrs prn pain 10 tablet 0   No facility-administered medications prior to visit.     Review of Systems:   Constitutional: No weight loss or gain, night sweats, fevers, chills, fatigue, or lassitude. HEENT: No headaches, difficulty swallowing, tooth/dental problems, or sore throat. No sneezing, itching, ear ache, nasal congestion, or post nasal drip CV:  No chest pain, orthopnea, PND, swelling in lower extremities, anasarca, dizziness, palpitations, syncope Resp: No shortness of breath with exertion or at rest. No excess mucus or change in color of mucus. No  productive or non-productive. No hemoptysis. No wheezing.  No chest wall deformity GI:  No heartburn, indigestion, abdominal pain, nausea, vomiting, diarrhea, change in bowel habits, loss of appetite, bloody stools.  GU: No dysuria, change in color of urine, urgency or frequency.  No flank pain, no hematuria  Skin: No rash, lesions, ulcerations MSK:  No joint pain or swelling.  No decreased range of motion.  No back pain. Neuro: No dizziness or lightheadedness.  Psych: No depression or anxiety. Mood stable.     Physical Exam:  There were no vitals taken for this visit.  GEN: Pleasant, interactive, well-nourished/chronically-ill appearing/acutely-ill appearing/poorly-nourished/morbidly obese; in no acute distress.****** HEENT:  Normocephalic and atraumatic. EACs patent bilaterally. TM pearly gray with present light reflex bilaterally. PERRLA. Sclera white. Nasal turbinates pink, moist and patent bilaterally. No rhinorrhea present. Oropharynx pink and moist, without exudate or edema. No lesions, ulcerations, or postnasal drip.  NECK:  Supple w/ fair ROM.  No JVD present. Normal carotid impulses w/o bruits. Thyroid symmetrical with no goiter or nodules palpated. No lymphadenopathy.   CV: RRR, no m/r/g, no peripheral edema. Pulses intact, +2 bilaterally. No cyanosis, pallor or clubbing. PULMONARY:  Unlabored, regular breathing. Clear bilaterally A&P w/o wheezes/rales/rhonchi. No accessory muscle use.  GI: BS present and normoactive. Soft, non-tender to palpation. No organomegaly or masses detected. No CVA tenderness. MSK: No erythema, warmth or tenderness. Cap refil <2 sec all extrem. No deformities or joint swelling noted.  Neuro: A/Ox3. No focal deficits noted.   Skin: Warm, no lesions or rashe Psych: Normal affect and behavior. Judgement and thought content appropriate.     Lab Results:  CBC    Component Value Date/Time   WBC 7.8 11/13/2021 1817   RBC 4.61 11/13/2021 1817   HGB 13.3  11/13/2021 1817   HCT 39.8 11/13/2021 1817   PLT 260 11/13/2021 1817   MCV 86.3 11/13/2021 1817   MCH 28.9 11/13/2021 1817   MCHC 33.4 11/13/2021 1817   RDW 13.6 11/13/2021 1817   LYMPHSABS 2.6 11/13/2021 1817   MONOABS 0.8 11/13/2021 1817   EOSABS 0.6 (H) 11/13/2021 1817   BASOSABS 0.1 11/13/2021 1817    BMET    Component Value Date/Time   NA 142 11/13/2021 1817   K 4.1 11/13/2021 1817   CL 113 (H) 11/13/2021 1817   CO2 21 (L) 11/13/2021 1817   GLUCOSE 91 11/13/2021 1817   BUN 25 (H) 11/13/2021 1817   CREATININE 1.15 11/13/2021 1817   CALCIUM 9.3 11/13/2021 1817   GFRNONAA >60 11/13/2021 1817    BNP    Component Value Date/Time   BNP 97.7 11/13/2021 1817     Imaging:  No results found.  Administration History     None           No data to display          No results found for: "NITRICOXIDE"      Assessment & Plan:   No problem-specific Assessment & Plan notes found for this encounter.   I spent *** minutes of dedicated to the care of this patient on the date of this encounter to include pre-visit review of records, face-to-face time with the patient discussing conditions above, post visit ordering of testing, clinical documentation with the electronic health record, making appropriate referrals as documented, and communicating necessary findings to members of the patients care team.  Noemi Chapel, NP 01/29/2023  Pt aware and understands NP's role.

## 2023-01-29 NOTE — Patient Instructions (Addendum)
Continue Albuterol inhaler 2 puffs or 3 mL neb every 6 hours as needed for shortness of breath or wheezing. Notify if symptoms persist despite rescue inhaler/neb use.  Continue Anoro 1 puff daily  Continue azelastine 2 sprays each nostril Twice daily  Continue flonase nasal spray 2 sprays each nostril daily  Continue arnuity 1 puff daily   Try ipratropium nasal spray 2-3 times a day for nasal drainage   I've sent a refill of prednisone for you to have on hand  Follow up in 4 months with Dr. Belia Heman or Philis Nettle. If symptoms do not improve or worsen, please contact office for sooner follow up or seek emergency care.

## 2023-01-30 ENCOUNTER — Encounter: Payer: Self-pay | Admitting: Nurse Practitioner

## 2023-01-30 DIAGNOSIS — J300 Vasomotor rhinitis: Secondary | ICD-10-CM | POA: Insufficient documentation

## 2023-01-30 NOTE — Assessment & Plan Note (Signed)
Recent exacerbation with acute bronchitis like symptoms and increased dyspnea.  He started prednisone, which he has 2 days left of.  Did not require any empiric antibiotics.  Clinically improved today.  Lung exam was clear.  Will send refill of prednisone for him to keep on hand at home.  He understands that if he is to have another flareup, he needs to call us to notify us so we can see him back in office.  Action plan in place.  Encouraged him to remain active.  He is helping his daughter renovate her house right now.  Patient Instructions  Continue Albuterol inhaler 2 puffs or 3 mL neb every 6 hours as needed for shortness of breath or wheezing. Notify if symptoms persist despite rescue inhaler/neb use.  Continue Anoro 1 puff daily  Continue azelastine 2 sprays each nostril Twice daily  Continue flonase nasal spray 2 sprays each nostril daily  Continue arnuity 1 puff daily   Try ipratropium nasal spray 2-3 times a day for nasal drainage   I've sent a refill of prednisone for you to have on hand  Follow up in 4 months with Dr. Belia Heman or Philis Nettle. If symptoms do not improve or worsen, please contact office for sooner follow up or seek emergency care.

## 2023-01-30 NOTE — Assessment & Plan Note (Signed)
Add on ipratropium nasal spray.  He will continue current regimen for allergies as well.

## 2023-02-03 ENCOUNTER — Encounter: Payer: Self-pay | Admitting: Emergency Medicine

## 2023-02-03 ENCOUNTER — Other Ambulatory Visit: Payer: Self-pay | Admitting: Emergency Medicine

## 2023-02-03 ENCOUNTER — Encounter: Payer: Self-pay | Admitting: Internal Medicine

## 2023-02-03 ENCOUNTER — Other Ambulatory Visit: Payer: Self-pay | Admitting: Internal Medicine

## 2023-02-03 DIAGNOSIS — J449 Chronic obstructive pulmonary disease, unspecified: Secondary | ICD-10-CM

## 2023-02-03 MED ORDER — TRAMADOL HCL 50 MG PO TABS: 50.0000 mg | ORAL_TABLET | Freq: Three times a day (TID) | ORAL | 0 refills | Status: AC | PRN

## 2023-02-03 NOTE — Telephone Encounter (Signed)
Prescription for tramadol sent to pharmacy of record today.  Thanks.

## 2023-02-13 ENCOUNTER — Encounter: Payer: Self-pay | Admitting: Emergency Medicine

## 2023-02-13 ENCOUNTER — Ambulatory Visit (INDEPENDENT_AMBULATORY_CARE_PROVIDER_SITE_OTHER): Payer: Medicare HMO | Admitting: Emergency Medicine

## 2023-02-13 VITALS — BP 124/68 | HR 83 | Temp 98.7°F | Ht 70.0 in | Wt 200.4 lb

## 2023-02-13 DIAGNOSIS — R42 Dizziness and giddiness: Secondary | ICD-10-CM | POA: Diagnosis not present

## 2023-02-13 DIAGNOSIS — R7303 Prediabetes: Secondary | ICD-10-CM

## 2023-02-13 DIAGNOSIS — E785 Hyperlipidemia, unspecified: Secondary | ICD-10-CM

## 2023-02-13 DIAGNOSIS — J449 Chronic obstructive pulmonary disease, unspecified: Secondary | ICD-10-CM

## 2023-02-13 DIAGNOSIS — C61 Malignant neoplasm of prostate: Secondary | ICD-10-CM | POA: Diagnosis not present

## 2023-02-13 LAB — COMPREHENSIVE METABOLIC PANEL
ALT: 15 U/L (ref 0–53)
AST: 21 U/L (ref 0–37)
Albumin: 3.9 g/dL (ref 3.5–5.2)
Alkaline Phosphatase: 81 U/L (ref 39–117)
BUN: 23 mg/dL (ref 6–23)
CO2: 25 meq/L (ref 19–32)
Calcium: 9.2 mg/dL (ref 8.4–10.5)
Chloride: 104 meq/L (ref 96–112)
Creatinine, Ser: 1.3 mg/dL (ref 0.40–1.50)
GFR: 48.63 mL/min — ABNORMAL LOW (ref >60.00)
Glucose, Bld: 102 mg/dL — ABNORMAL HIGH (ref 70–99)
Potassium: 4.3 meq/L (ref 3.5–5.1)
Sodium: 136 meq/L (ref 135–145)
Total Bilirubin: 0.6 mg/dL (ref 0.2–1.2)
Total Protein: 7 g/dL (ref 6.0–8.3)

## 2023-02-13 LAB — CBC WITH DIFFERENTIAL/PLATELET
Basophils Absolute: 0.1 10*3/uL (ref 0.0–0.1)
Basophils Relative: 0.8 % (ref 0.0–3.0)
Eosinophils Absolute: 0 10*3/uL (ref 0.0–0.7)
Eosinophils Relative: 0.1 % (ref 0.0–5.0)
HCT: 41.7 % (ref 39.0–52.0)
Hemoglobin: 13.2 g/dL (ref 13.0–17.0)
Lymphocytes Relative: 37.9 % (ref 12.0–46.0)
Lymphs Abs: 4.4 10*3/uL — ABNORMAL HIGH (ref 0.7–4.0)
MCHC: 31.7 g/dL (ref 30.0–36.0)
MCV: 88.7 fL (ref 78.0–100.0)
Monocytes Absolute: 1.7 10*3/uL — ABNORMAL HIGH (ref 0.1–1.0)
Monocytes Relative: 14.6 % — ABNORMAL HIGH (ref 3.0–12.0)
Neutro Abs: 5.4 10*3/uL (ref 1.4–7.7)
Neutrophils Relative %: 46.6 % (ref 43.0–77.0)
Platelets: 274 10*3/uL (ref 150.0–400.0)
RBC: 4.71 Mil/uL (ref 4.22–5.81)
RDW: 14 % (ref 11.5–15.5)
WBC: 11.5 10*3/uL — ABNORMAL HIGH (ref 4.0–10.5)

## 2023-02-13 LAB — LIPID PANEL
Cholesterol: 217 mg/dL — ABNORMAL HIGH (ref 0–200)
HDL: 48.1 mg/dL (ref >39.00)
LDL Cholesterol: 138 mg/dL — ABNORMAL HIGH (ref 0–99)
NonHDL: 168.47
Total CHOL/HDL Ratio: 5
Triglycerides: 151 mg/dL — ABNORMAL HIGH (ref 0.0–149.0)
VLDL: 30.2 mg/dL (ref 0.0–40.0)

## 2023-02-13 NOTE — Assessment & Plan Note (Signed)
Chronic stable condition Intolerant to statins. Lipid profile done today Diet and nutrition discussed

## 2023-02-13 NOTE — Patient Instructions (Signed)
Health Maintenance After Age 87 After age 87, you are at a higher risk for certain long-term diseases and infections as well as injuries from falls. Falls are a major cause of broken bones and head injuries in people who are older than age 87. Getting regular preventive care can help to keep you healthy and well. Preventive care includes getting regular testing and making lifestyle changes as recommended by your health care provider. Talk with your health care provider about: Which screenings and tests you should have. A screening is a test that checks for a disease when you have no symptoms. A diet and exercise plan that is right for you. What should I know about screenings and tests to prevent falls? Screening and testing are the best ways to find a health problem early. Early diagnosis and treatment give you the best chance of managing medical conditions that are common after age 87. Certain conditions and lifestyle choices may make you more likely to have a fall. Your health care provider may recommend: Regular vision checks. Poor vision and conditions such as cataracts can make you more likely to have a fall. If you wear glasses, make sure to get your prescription updated if your vision changes. Medicine review. Work with your health care provider to regularly review all of the medicines you are taking, including over-the-counter medicines. Ask your health care provider about any side effects that may make you more likely to have a fall. Tell your health care provider if any medicines that you take make you feel dizzy or sleepy. Strength and balance checks. Your health care provider may recommend certain tests to check your strength and balance while standing, walking, or changing positions. Foot health exam. Foot pain and numbness, as well as not wearing proper footwear, can make you more likely to have a fall. Screenings, including: Osteoporosis screening. Osteoporosis is a condition that causes  the bones to get weaker and break more easily. Blood pressure screening. Blood pressure changes and medicines to control blood pressure can make you feel dizzy. Depression screening. You may be more likely to have a fall if you have a fear of falling, feel depressed, or feel unable to do activities that you used to do. Alcohol use screening. Using too much alcohol can affect your balance and may make you more likely to have a fall. Follow these instructions at home: Lifestyle Do not drink alcohol if: Your health care provider tells you not to drink. If you drink alcohol: Limit how much you have to: 0-1 drink a day for women. 0-2 drinks a day for men. Know how much alcohol is in your drink. In the [place] one drink equals one 12 oz bottle of beer (355 mL), one 5 oz glass of wine (148 mL), or one 1 oz glass of hard liquor (44 mL). Do not use any products that contain nicotine or tobacco. These products include cigarettes, chewing tobacco, and vaping devices, such as e-cigarettes. If you need help quitting, ask your health care provider. Activity  Follow a regular exercise program to stay fit. This will help you maintain your balance. Ask your health care provider what types of exercise are appropriate for you. If you need a cane or walker, use it as recommended by your health care provider. Wear supportive shoes that have nonskid soles. Safety  Remove any tripping hazards, such as rugs, cords, and clutter. Install safety equipment such as grab bars in bathrooms and safety rails on stairs. Keep rooms and walkways   well-lit. General instructions Talk with your health care provider about your risks for falling. Tell your health care provider if: You fall. Be sure to tell your health care provider about all falls, even ones that seem minor. You feel dizzy, tiredness (fatigue), or off-balance. Take over-the-counter and prescription medicines only as told by your health care provider. These include  supplements. Eat a healthy diet and maintain a healthy weight. A healthy diet includes low-fat dairy products, low-fat (lean) meats, and fiber from whole grains, beans, and lots of fruits and vegetables. Stay current with your vaccines. Schedule regular health, dental, and eye exams. Summary Having a healthy lifestyle and getting preventive care can help to protect your health and wellness after age 87. Screening and testing are the best way to find a health problem early and help you avoid having a fall. Early diagnosis and treatment give you the best chance for managing medical conditions that are more common for people who are older than age 87. Falls are a major cause of broken bones and head injuries in people who are older than age 87. Take precautions to prevent a fall at home. Work with your health care provider to learn what changes you can make to improve your health and wellness and to prevent falls. This information is not intended to replace advice given to you by your health care provider. Make sure you discuss any questions you have with your health care provider. Document Revised: 08/28/2020 Document Reviewed: 08/28/2020 Elsevier Patient Education  2024 Elsevier Inc.  

## 2023-02-13 NOTE — Assessment & Plan Note (Signed)
Intermittent.  Episode this morning. Related to medications and chronic medical conditions Advised to avoid getting up too fast Clinically stable.  No neurodeficits. No significant falls.  Fall precautions given.

## 2023-02-13 NOTE — Assessment & Plan Note (Signed)
Clinically stable. Diet and nutrition discussed.

## 2023-02-13 NOTE — Assessment & Plan Note (Signed)
Clinically stable. Continues daily Anoro Ellipta and albuterol nebulizers as needed

## 2023-02-13 NOTE — Progress Notes (Signed)
Brian Hopkins 89 y.o.   Chief Complaint  Patient presents with   Medical Management of Chronic Issues    f/u appt, patient is having some issues with dizziness when getting up in the morning. Patient fell this am trying to get up out of bed.     HISTORY OF PRESENT ILLNESS: This is a 89 y.o. male A1A here for 71-month follow up of chronic medical conditions History of prostate cancer.  Status post orchiectomy bilateral.  Last PSA 0.54. Overall doing well.  Accompanied by daughter. Felt a little dizzy this morning getting up and fell while getting out of bed without any significant injuries. No other complaints or medical concerns today. BP Readings from Last 3 Encounters:  01/29/23 114/70  10/17/22 (!) 168/83  07/22/22 130/72     HPI   Prior to Admission medications   Medication Sig Start Date End Date Taking? Authorizing Provider  acetaminophen (TYLENOL) 650 MG CR tablet Take 1,300 mg by mouth every 8 (eight) hours as needed for pain.   Yes [provider]  albuterol (VENTOLIN HFA) 108 (90 Base) MCG/ACT inhaler INHALE 2 PUFFS INTO THE LUNGS EVERY 4 (FOUR) HOURS AS NEEDED FOR SHORTNESS OF BREATH 12/31/22  Yes Kasa, Wallis Bamberg, MD  aluminum hydroxide-magnesium carbonate (GAVISCON) 95-358 MG/15ML SUSP Take by mouth as needed for heartburn or indigestion.   Yes [provider]  ANORO ELLIPTA 62.5-25 MCG/ACT AEPB TAKE 1 PUFF BY MOUTH EVERY DAY 01/13/23  Yes Erin Fulling, MD  azelastine (ASTELIN) 0.1 % nasal spray Place 2 sprays into both nostrils 2 (two) times daily. Patient taking differently: Place 2 sprays into both nostrils 2 (two) times daily as needed for rhinitis or allergies. 03/29/21  Yes Erin Fulling, MD  clonazePAM (KLONOPIN) 0.5 MG tablet Take 1 tablet (0.5 mg total) by mouth daily as needed for anxiety. 12/31/22  Yes Cali Cuartas, Eilleen Kempf, MD  fluticasone (FLONASE) 50 MCG/ACT nasal spray SPRAY 1 SPRAY INTO BOTH NOSTRILS DAILY. Patient taking differently:  Place 1 spray into both nostrils at bedtime. 05/13/22  Yes Salena Saner, MD  Fluticasone Furoate (ARNUITY ELLIPTA) 200 MCG/ACT AEPB Inhale 1 puff into the lungs daily. Patient taking differently: Inhale 1 puff into the lungs daily as needed. 09/10/22  Yes Erin Fulling, MD  hydrOXYzine (ATARAX) 25 MG tablet TAKE 1 TABLET BY MOUTH EVERY 8 HOURS AS NEEDED FOR ANXIETY 12/01/22  Yes Dayne Chait, Eilleen Kempf, MD  azithromycin (ZITHROMAX) 250 MG tablet Take 2 tablets (500 mg) on  Day 1,  followed by 1 tablet (250 mg) once daily on Days 2 through 5. Patient not taking: Reported on 02/13/2023 10/10/22   Salena Saner, MD  Ibuprofen-Acetaminophen (ADVIL DUAL ACTION) 125-250 MG TABS Take 2 tablets by mouth every 8 (eight) hours as needed.    [provider]  ipratropium (ATROVENT) 0.06 % nasal spray Place 2 sprays into both nostrils 4 (four) times daily. 01/29/23   Cobb, Ruby Cola, NP  ipratropium-albuterol (DUONEB) 0.5-2.5 (3) MG/3ML SOLN Inhale 3 mLs into the lungs every 6 (six) hours as needed. 07/09/22   Erin Fulling, MD  magnesium hydroxide (MILK OF MAGNESIA) 400 MG/5ML suspension Take by mouth daily as needed for mild constipation.    [provider]  Multiple Vitamin (MULTIVITAMIN) capsule Take 1 capsule by mouth daily.    [provider]  Polyvinyl Alcohol-Povidone (REFRESH OP) Apply to eye as needed.    [provider]  predniSONE (DELTASONE) 10 MG tablet 4 tabs for 2 days,  then 3 tabs for 2 days, 2 tabs for 2 days, then 1 tab for 2 days, then stop 01/29/23   Cobb, Ruby Cola, NP    Allergies  Allergen Reactions   Statins Other (See Comments)    myalgias   Augmentin [Amoxicillin-Pot Clavulanate] Rash   Sulfa Antibiotics Rash    Patient Active Problem List   Diagnosis Date Noted   Vasomotor rhinitis 01/30/2023   Chronic obstructive pulmonary disease (HCC) 01/14/2022   Auditory complaints of both ears 01/14/2022   History of elevated PSA 07/11/2021    Prediabetes 07/11/2021   History of COPD 07/11/2021   Dyslipidemia 12/16/2006    Past Medical History:  Diagnosis Date   Age-related macular degeneration, dry, right eye    Age-related macular degeneration, wet, left eye (HCC)    10-09-2022  per pt eye injection's every 8 weeks   COPD (chronic obstructive pulmonary disease) (HCC)    pulmnologist--- dr Army Chaco;  mild copd w/ emphysema;   does not have oxygen   Decreased vision of left eye    DOE (dyspnea on exertion)    Dyslipidemia    GERD (gastroesophageal reflux disease)    Hiatal hernia    History of ulcerative colitis 2010   (10-09-2022  per pt has not had issues in several years)  previously followed by [facility] last seen 2017 (office note in care everywhere) -- left sided UC  in clinical remission ,  dx 2010 ,   History of vertebral compression fracture 2022   T8   HOH (hard of hearing)    left > right   Malignant neoplasm prostate (HCC) 07/2022   urologist--- dr Retta Diones;  bx 04/ 2024, Gleason 4+3   Nocturia    OSA (obstructive sleep apnea) 2017   pulmonologist-- dr Erin Fulling;   stopped using cpap   PVC's (premature ventricular contractions)    Seasonal allergies     Past Surgical History:  Procedure Laterality Date   APPENDECTOMY  1957   open   CATARACT EXTRACTION W/ INTRAOCULAR LENS IMPLANT Right 2019   CHOLECYSTECTOMY OPEN  1985   COLONOSCOPY  2015   KNEE SURGERY Left 1952   per pt tumor removed, benign   ORCHIECTOMY Bilateral 10/17/2022   Procedure: SIMPLE ORCHIECTOMY;  Surgeon: Marcine Matar, MD;  Location: [facility];  Service: Urology;  Laterality: Bilateral;  30 MINS   TONSILLECTOMY     child    Social History   Socioeconomic History   Marital status: Widowed    Spouse name: Not on file   Number of children: Not on file   Years of education: Not on file   Highest education level: Some college, no degree  Occupational History   Not on file  Tobacco Use   Smoking status:  Former    Current packs/day: 0.00    Types: Cigarettes    Start date: 59    Quit date: 72    Years since quitting: 70.8   Smokeless tobacco: Never  Vaping Use   Vaping status: Never Used  Substance and Sexual Activity   Alcohol use: Not Currently   Drug use: Never   Sexual activity: Not on file  Other Topics Concern   Not on file  Social History Narrative   Not on file   Social Determinants of Health   Financial Resource Strain: Low Risk  (02/12/2023)   Overall Financial Resource Strain (CARDIA)    Difficulty of Paying Living Expenses: Not hard at all  Food Insecurity: No Food Insecurity (02/12/2023)   Hunger Vital Sign    Worried About Running Out of Food in the Last Year: Never true    Ran Out of Food in the Last Year: Never true  Transportation Needs: No Transportation Needs (02/12/2023)   PRAPARE - Administrator, Civil Service (Medical): No    Lack of Transportation (Non-Medical): No  Physical Activity: Insufficiently Active (02/12/2023)   Exercise Vital Sign    Days of Exercise per Week: 1 day    Minutes of Exercise per Session: 10 min  Stress: No Stress Concern Present (02/12/2023)   [employer] of Occupational Health - Occupational Stress Questionnaire    Feeling of Stress : Only a little  Social Connections: Socially Isolated (02/12/2023)   Social Connection and Isolation Panel [NHANES]    Frequency of Communication with Friends and Family: Twice a week    Frequency of Social Gatherings with Friends and Family: Once a week    Attends Religious Services: Never    Database administrator or Organizations: No    Attends Engineer, structural: Not on file    Marital Status: Widowed  Intimate Partner Violence: Not on file    History reviewed. No pertinent family history.   Review of Systems  Constitutional: Negative.  Negative for chills and fever.  HENT: Negative.  Negative for congestion and sore throat.   Respiratory: Negative.   Negative for cough and shortness of breath.   Cardiovascular: Negative.  Negative for chest pain and palpitations.  Gastrointestinal:  Negative for abdominal pain, nausea and vomiting.  Genitourinary: Negative.  Negative for dysuria and hematuria.  Skin: Negative.  Negative for rash.  Neurological:  Positive for dizziness.  All other systems reviewed and are negative.   Today's Vitals   02/13/23 1306  BP: 124/68  Pulse: 83  Temp: 98.7 F (37.1 C)  TempSrc: Oral  SpO2: 92%  Weight: 200 lb 6 oz (90.9 kg)  Height: 5\' 10"  (1.778 m)   Body mass index is 28.75 kg/m.   Physical Exam Vitals reviewed.  Constitutional:      Appearance: Normal appearance.  HENT:     Head: Normocephalic.     Mouth/Throat:     Mouth: Mucous membranes are moist.     Pharynx: Oropharynx is clear.  Eyes:     Extraocular Movements: Extraocular movements intact.     Pupils: Pupils are equal, round, and reactive to light.  Cardiovascular:     Rate and Rhythm: Normal rate and regular rhythm.     Pulses: Normal pulses.     Heart sounds: Normal heart sounds.  Pulmonary:     Effort: Pulmonary effort is normal.     Breath sounds: Normal breath sounds.  Musculoskeletal:     Right lower leg: No edema.     Left lower leg: No edema.  Skin:    General: Skin is warm and dry.  Neurological:     General: No focal deficit present.     Mental Status: He is alert and oriented to person, place, and time.  Psychiatric:        Mood and Affect: Mood normal.        Behavior: Behavior normal.      ASSESSMENT & PLAN: A total of 44 minutes was spent with the patient and counseling/coordination of care regarding preparing for this visit, review of most recent office visit notes, review of most recent urologists office visit notes, review of multiple  chronic medical conditions under management, review of all medications, review of most recent blood work results, education on nutrition, fall precautions, how to avoid  periods of dizziness, prognosis, documentation and need for follow-up.  Problem List Items Addressed This Visit       Respiratory   Chronic obstructive pulmonary disease (HCC)    Clinically stable. Continues daily Anoro Ellipta and albuterol nebulizers as needed      Relevant Orders   CBC with Differential/Platelet   Comprehensive metabolic panel     Genitourinary   Prostate cancer (HCC) - Primary    Localized.  No signs of metastatic disease Treated with bilateral orchiectomy PSA under 1.  Recent urologist office visit notes reviewed. Doing well and tolerating symptoms of hormonal deficiency.        Other   Dyslipidemia    Chronic stable condition Intolerant to statins. Lipid profile done today Diet and nutrition discussed      Relevant Orders   CBC with Differential/Platelet   Comprehensive metabolic panel   Lipid panel   Prediabetes    Clinically stable. Diet and nutrition discussed.      Dizziness    Intermittent.  Episode this morning. Related to medications and chronic medical conditions Advised to avoid getting up too fast Clinically stable.  No neurodeficits. No significant falls.  Fall precautions given.      Patient Instructions  Health Maintenance After Age 60 After age 68, you are at a higher risk for certain long-term diseases and infections as well as injuries from falls. Falls are a major cause of broken bones and head injuries in people who are older than age 98. Getting regular preventive care can help to keep you healthy and well. Preventive care includes getting regular testing and making lifestyle changes as recommended by your health care provider. Talk with your health care provider about: Which screenings and tests you should have. A screening is a test that checks for a disease when you have no symptoms. A diet and exercise plan that is right for you. What should I know about screenings and tests to prevent falls? Screening and testing  are the best ways to find a health problem early. Early diagnosis and treatment give you the best chance of managing medical conditions that are common after age 91. Certain conditions and lifestyle choices may make you more likely to have a fall. Your health care provider may recommend: Regular vision checks. Poor vision and conditions such as cataracts can make you more likely to have a fall. If you wear glasses, make sure to get your prescription updated if your vision changes. Medicine review. Work with your health care provider to regularly review all of the medicines you are taking, including over-the-counter medicines. Ask your health care provider about any side effects that may make you more likely to have a fall. Tell your health care provider if any medicines that you take make you feel dizzy or sleepy. Strength and balance checks. Your health care provider may recommend certain tests to check your strength and balance while standing, walking, or changing positions. Foot health exam. Foot pain and numbness, as well as not wearing proper footwear, can make you more likely to have a fall. Screenings, including: Osteoporosis screening. Osteoporosis is a condition that causes the bones to get weaker and break more easily. Blood pressure screening. Blood pressure changes and medicines to control blood pressure can make you feel dizzy. Depression screening. You may be more likely to have a  fall if you have a fear of falling, feel depressed, or feel unable to do activities that you used to do. Alcohol use screening. Using too much alcohol can affect your balance and may make you more likely to have a fall. Follow these instructions at home: Lifestyle Do not drink alcohol if: Your health care provider tells you not to drink. If you drink alcohol: Limit how much you have to: 0-1 drink a day for women. 0-2 drinks a day for men. Know how much alcohol is in your drink. In the [place] one drink equals  one 12 oz bottle of beer (355 mL), one 5 oz glass of wine (148 mL), or one 1 oz glass of hard liquor (44 mL). Do not use any products that contain nicotine or tobacco. These products include cigarettes, chewing tobacco, and vaping devices, such as e-cigarettes. If you need help quitting, ask your health care provider. Activity  Follow a regular exercise program to stay fit. This will help you maintain your balance. Ask your health care provider what types of exercise are appropriate for you. If you need a cane or walker, use it as recommended by your health care provider. Wear supportive shoes that have nonskid soles. Safety  Remove any tripping hazards, such as rugs, cords, and clutter. Install safety equipment such as grab bars in bathrooms and safety rails on stairs. Keep rooms and walkways well-lit. General instructions Talk with your health care provider about your risks for falling. Tell your health care provider if: You fall. Be sure to tell your health care provider about all falls, even ones that seem minor. You feel dizzy, tiredness (fatigue), or off-balance. Take over-the-counter and prescription medicines only as told by your health care provider. These include supplements. Eat a healthy diet and maintain a healthy weight. A healthy diet includes low-fat dairy products, low-fat (lean) meats, and fiber from whole grains, beans, and lots of fruits and vegetables. Stay current with your vaccines. Schedule regular health, dental, and eye exams. Summary Having a healthy lifestyle and getting preventive care can help to protect your health and wellness after age 80. Screening and testing are the best way to find a health problem early and help you avoid having a fall. Early diagnosis and treatment give you the best chance for managing medical conditions that are more common for people who are older than age 52. Falls are a major cause of broken bones and head injuries in people who are  older than age 62. Take precautions to prevent a fall at home. Work with your health care provider to learn what changes you can make to improve your health and wellness and to prevent falls. This information is not intended to replace advice given to you by your health care provider. Make sure you discuss any questions you have with your health care provider. Document Revised: 08/28/2020 Document Reviewed: 08/28/2020 Elsevier Patient Education  2024 Elsevier Inc.     Edwina Barth, MD [facility]

## 2023-02-13 NOTE — Assessment & Plan Note (Signed)
Localized.  No signs of metastatic disease Treated with bilateral orchiectomy PSA under 1.  Recent urologist office visit notes reviewed. Doing well and tolerating symptoms of hormonal deficiency.

## 2023-03-12 ENCOUNTER — Encounter: Payer: Self-pay | Admitting: Internal Medicine

## 2023-03-13 NOTE — Telephone Encounter (Signed)
Dr. Belia Heman, patient would like a Prednisone prescription sent to his pharmacy to have on hand incase he has a flare up. Please advise.

## 2023-03-17 ENCOUNTER — Other Ambulatory Visit: Payer: Self-pay | Admitting: Nurse Practitioner

## 2023-03-17 DIAGNOSIS — J449 Chronic obstructive pulmonary disease, unspecified: Secondary | ICD-10-CM

## 2023-03-17 MED ORDER — PREDNISONE 20 MG PO TABS: 20.0000 mg | ORAL_TABLET | Freq: Every day | ORAL | 2 refills | Status: DC

## 2023-05-28 DIAGNOSIS — I129 Hypertensive chronic kidney disease with stage 1 through stage 4 chronic kidney disease, or unspecified chronic kidney disease: Secondary | ICD-10-CM | POA: Diagnosis not present

## 2023-05-28 DIAGNOSIS — Z008 Encounter for other general examination: Secondary | ICD-10-CM | POA: Diagnosis not present

## 2023-05-28 DIAGNOSIS — M810 Age-related osteoporosis without current pathological fracture: Secondary | ICD-10-CM | POA: Diagnosis not present

## 2023-05-28 DIAGNOSIS — N529 Male erectile dysfunction, unspecified: Secondary | ICD-10-CM | POA: Diagnosis not present

## 2023-05-28 DIAGNOSIS — E669 Obesity, unspecified: Secondary | ICD-10-CM | POA: Diagnosis not present

## 2023-05-28 DIAGNOSIS — Z87891 Personal history of nicotine dependence: Secondary | ICD-10-CM | POA: Diagnosis not present

## 2023-05-28 DIAGNOSIS — J439 Emphysema, unspecified: Secondary | ICD-10-CM | POA: Diagnosis not present

## 2023-05-28 DIAGNOSIS — Z809 Family history of malignant neoplasm, unspecified: Secondary | ICD-10-CM | POA: Diagnosis not present

## 2023-05-28 DIAGNOSIS — M1990 Unspecified osteoarthritis, unspecified site: Secondary | ICD-10-CM | POA: Diagnosis not present

## 2023-05-28 DIAGNOSIS — E785 Hyperlipidemia, unspecified: Secondary | ICD-10-CM | POA: Diagnosis not present

## 2023-05-28 DIAGNOSIS — N400 Enlarged prostate without lower urinary tract symptoms: Secondary | ICD-10-CM | POA: Diagnosis not present

## 2023-05-28 DIAGNOSIS — K219 Gastro-esophageal reflux disease without esophagitis: Secondary | ICD-10-CM | POA: Diagnosis not present

## 2023-05-28 DIAGNOSIS — H353290 Exudative age-related macular degeneration, unspecified eye, stage unspecified: Secondary | ICD-10-CM | POA: Diagnosis not present

## 2023-06-05 DIAGNOSIS — H353211 Exudative age-related macular degeneration, right eye, with active choroidal neovascularization: Secondary | ICD-10-CM | POA: Diagnosis not present

## 2023-06-05 DIAGNOSIS — H43393 Other vitreous opacities, bilateral: Secondary | ICD-10-CM | POA: Diagnosis not present

## 2023-06-05 DIAGNOSIS — H35033 Hypertensive retinopathy, bilateral: Secondary | ICD-10-CM | POA: Diagnosis not present

## 2023-06-05 DIAGNOSIS — H353223 Exudative age-related macular degeneration, left eye, with inactive scar: Secondary | ICD-10-CM | POA: Diagnosis not present

## 2023-06-05 DIAGNOSIS — H43813 Vitreous degeneration, bilateral: Secondary | ICD-10-CM | POA: Diagnosis not present

## 2023-06-14 ENCOUNTER — Encounter: Payer: Self-pay | Admitting: Internal Medicine

## 2023-06-15 ENCOUNTER — Other Ambulatory Visit: Payer: Self-pay | Admitting: Internal Medicine

## 2023-06-15 DIAGNOSIS — J449 Chronic obstructive pulmonary disease, unspecified: Secondary | ICD-10-CM

## 2023-06-15 MED ORDER — PREDNISONE 20 MG PO TABS: 20.0000 mg | ORAL_TABLET | Freq: Every day | ORAL | 1 refills | Status: DC

## 2023-06-15 NOTE — Progress Notes (Signed)
 Refilled prednisone as per request

## 2023-06-23 ENCOUNTER — Other Ambulatory Visit: Payer: Self-pay | Admitting: Pulmonary Disease

## 2023-06-23 DIAGNOSIS — J449 Chronic obstructive pulmonary disease, unspecified: Secondary | ICD-10-CM

## 2023-06-26 DIAGNOSIS — L578 Other skin changes due to chronic exposure to nonionizing radiation: Secondary | ICD-10-CM | POA: Diagnosis not present

## 2023-06-26 DIAGNOSIS — L820 Inflamed seborrheic keratosis: Secondary | ICD-10-CM | POA: Diagnosis not present

## 2023-06-26 DIAGNOSIS — C44219 Basal cell carcinoma of skin of left ear and external auricular canal: Secondary | ICD-10-CM | POA: Diagnosis not present

## 2023-06-26 DIAGNOSIS — L538 Other specified erythematous conditions: Secondary | ICD-10-CM | POA: Diagnosis not present

## 2023-06-26 DIAGNOSIS — Z08 Encounter for follow-up examination after completed treatment for malignant neoplasm: Secondary | ICD-10-CM | POA: Diagnosis not present

## 2023-06-26 DIAGNOSIS — L821 Other seborrheic keratosis: Secondary | ICD-10-CM | POA: Diagnosis not present

## 2023-06-26 DIAGNOSIS — L570 Actinic keratosis: Secondary | ICD-10-CM | POA: Diagnosis not present

## 2023-06-26 DIAGNOSIS — Z09 Encounter for follow-up examination after completed treatment for conditions other than malignant neoplasm: Secondary | ICD-10-CM | POA: Diagnosis not present

## 2023-06-26 DIAGNOSIS — L814 Other melanin hyperpigmentation: Secondary | ICD-10-CM | POA: Diagnosis not present

## 2023-06-26 DIAGNOSIS — Z86007 Personal history of in-situ neoplasm of skin: Secondary | ICD-10-CM | POA: Diagnosis not present

## 2023-06-26 DIAGNOSIS — D485 Neoplasm of uncertain behavior of skin: Secondary | ICD-10-CM | POA: Diagnosis not present

## 2023-07-01 ENCOUNTER — Other Ambulatory Visit: Payer: Self-pay | Admitting: Pulmonary Disease

## 2023-07-03 ENCOUNTER — Encounter: Payer: Self-pay | Admitting: Internal Medicine

## 2023-07-12 ENCOUNTER — Other Ambulatory Visit: Payer: Self-pay | Admitting: Emergency Medicine

## 2023-07-19 ENCOUNTER — Other Ambulatory Visit: Payer: Self-pay | Admitting: Internal Medicine

## 2023-07-19 DIAGNOSIS — J449 Chronic obstructive pulmonary disease, unspecified: Secondary | ICD-10-CM

## 2023-07-20 ENCOUNTER — Other Ambulatory Visit: Payer: Self-pay | Admitting: Internal Medicine

## 2023-07-20 DIAGNOSIS — J441 Chronic obstructive pulmonary disease with (acute) exacerbation: Secondary | ICD-10-CM

## 2023-07-20 MED ORDER — AZITHROMYCIN 250 MG PO TABS: ORAL_TABLET | ORAL | 0 refills | Status: DC

## 2023-07-20 NOTE — Progress Notes (Signed)
 Z pak requested, patient on prednisone

## 2023-07-31 DIAGNOSIS — H353211 Exudative age-related macular degeneration, right eye, with active choroidal neovascularization: Secondary | ICD-10-CM | POA: Diagnosis not present

## 2023-07-31 DIAGNOSIS — H353223 Exudative age-related macular degeneration, left eye, with inactive scar: Secondary | ICD-10-CM | POA: Diagnosis not present

## 2023-07-31 DIAGNOSIS — H43813 Vitreous degeneration, bilateral: Secondary | ICD-10-CM | POA: Diagnosis not present

## 2023-07-31 DIAGNOSIS — H35033 Hypertensive retinopathy, bilateral: Secondary | ICD-10-CM | POA: Diagnosis not present

## 2023-07-31 DIAGNOSIS — H43393 Other vitreous opacities, bilateral: Secondary | ICD-10-CM | POA: Diagnosis not present

## 2023-08-12 ENCOUNTER — Ambulatory Visit: Admitting: Internal Medicine

## 2023-08-12 ENCOUNTER — Encounter: Payer: Self-pay | Admitting: Internal Medicine

## 2023-08-12 NOTE — Progress Notes (Deleted)
 Name: Brian Hopkins MRN: [id number] DOB: 1932/03/28     CONSULTATION DATE: 08/12/2023   CHIEF COMPLAINT:  Follow-up assessment of COPD    HISTORY OF PRESENT ILLNESS: Assessment of OSA Patient has stopped using CPAP many years ago Patient had problem with the mask and too much pressure and decided not to use CPAP anymore I have explained that patient is at high risk for stroke heart problems  Assessment of COPD Very mild disease in 2018 FEV1 was 90% predicted Scooping of the expiratory limbs Patient currently on Anoro and Flovent  In the last several weeks patient was given prednisone  and antibiotics for COPD exacerbation    PAST MEDICAL HISTORY :   has a past medical history of Age-related macular degeneration, dry, right eye, Age-related macular degeneration, wet, left eye (HCC), COPD (chronic obstructive pulmonary disease) (HCC), Decreased vision of left eye, DOE (dyspnea on exertion), Dyslipidemia, GERD (gastroesophageal reflux disease), Hiatal hernia, History of ulcerative colitis (2010), History of vertebral compression fracture (2022), HOH (hard of hearing), Malignant neoplasm prostate (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  has a past surgical history that includes Cholecystectomy open (1985); Appendectomy (1957); Tonsillectomy; Colonoscopy (2015); Cataract extraction w/ intraocular lens implant (Right, 2019); Knee surgery (Left, 1952); and Orchiectomy (Bilateral, 10/17/2022). Prior to Admission medications   Medication Sig Start Date End Date Taking? Authorizing Provider  acetaminophen  (TYLENOL ) 650 MG CR tablet Take 1,300 mg by mouth every 8 (eight) hours as needed for pain.    [provider]  albuterol  (VENTOLIN  HFA) 108 (90 Base) MCG/ACT inhaler Inhale 2 puffs into the lungs every 4 (four) hours as needed for shortness of breath. 11/01/13   [provider]   azelastine  (ASTELIN ) 0.1 % nasal spray Place 2 sprays into the nose 2 (two) times daily as needed for congestion.    [provider]  benzonatate (TESSALON) 200 MG capsule Take 200 mg by mouth 3 (three) times daily as needed for cough. 02/27/21   [provider]  Calcium-Magnesium-Zinc 333-133-5 MG TABS Take 1 tablet by mouth daily.    [provider]  Cholecalciferol 125 MCG (5000 UT) TABS Take 5,000 Units by mouth daily.    [provider]  famotidine (PEPCID) 20 MG tablet Take 20 mg by mouth daily as needed for indigestion.    [provider]  ipratropium-albuterol  (DUONEB) 0.5-2.5 (3) MG/3ML SOLN Inhale 3 mLs into the lungs every 6 (six) hours as needed for shortness of breath or wheezing. 10/17/16   [provider]  methocarbamol (ROBAXIN) 750 MG tablet Take 750 mg by mouth in the morning, at noon, and at bedtime. 02/27/21   [provider]  mometasone -formoterol  (DULERA ) 200-5 MCG/ACT AERO Inhale 2 puffs into the lungs 2 (two) times daily. 03/13/21   Haviland, Julie, MD  polyethylene glycol powder (GLYCOLAX/MIRALAX) 17 GM/SCOOP powder Take 17 g by mouth daily as needed for constipation. 02/27/21   [provider]  predniSONE  (STERAPRED UNI-PAK 21 TAB) 10 MG (21) TBPK tablet Take by mouth daily. Take 6 tabs by mouth daily  for 2 days, then 5 tabs for 2 days, then 4 tabs for 2 days, then 3 tabs for 2 days, 2 tabs for 2 days, then 1 tab by mouth daily for 2 days 03/13/21   Haviland, Julie, MD  umeclidinium-vilanterol (ANORO ELLIPTA ) 62.5-25 MCG/ACT AEPB Inhale 1 puff into the lungs daily. 11/13/16   [provider]   Allergies  Allergen Reactions  Statins Other (See Comments)    myalgias   Augmentin  [Amoxicillin -Pot Clavulanate] Rash   Sulfa Antibiotics Rash    FAMILY HISTORY:  family history is not on file. SOCIAL HISTORY:  reports that he quit smoking about 71 years ago. His smoking use included cigarettes. He  started smoking about 73 years ago. He has never used smokeless tobacco. He reports that he does not currently use alcohol. He reports that he does not use drugs.      Review of Systems: Gen:  Denies  fever, sweats, chills weight loss  HEENT: Denies blurred vision, double vision, ear pain, eye pain, hearing loss, nose bleeds, sore throat Cardiac:  No dizziness, chest pain or heaviness, chest tightness,edema, No JVD Resp:   No cough, -sputum production, -shortness of breath,-wheezing, -hemoptysis,  Other:  All other systems negative   Physical Examination:   General Appearance: No distress  EYES PERRLA, EOM intact.   NECK Supple, No JVD Pulmonary: normal breath sounds, No wheezing.  CardiovascularNormal S1,S2.  No m/r/g.   Abdomen: Benign, Soft, non-tender. Neurology UE/LE 5/5 strength, no focal deficits Ext pulses intact, cap refill intact ALL OTHER ROS ARE NEGATIVE      ASSESSMENT AND PLAN SYNOPSIS  89 year old pleasant white male seen today for follow-up assessment for sleep apnea and COPD  Regarding sleep apnea Patient has stopped using his CPAP machine Risks of noncompliant explained to patient with high risk of stroke, arrhythmias Patient seems to be happy not using CPAP anymore  COPD Currently on LAMA and LABA and inhaled corticosteroid regimen Patient on Anoro and Flovent  Patient has been prescribed backup prednisone  and antibiotics as requested Exacerbation at this time likely due to seasonal allergies Continue prednisone  and Z-Pak as prescribed Continue Zyrtec as tolerated     MEDICATION ADJUSTMENTS/LABS AND TESTS ORDERED: CONTINUE  FLOVENT  HFA 200 CONTINUE ANORO Continue prednisone  therapy Please start Z-Pak Continue Zyrtec for allergies Use albuterol  nebulizer as needed   CURRENT MEDICATIONS REVIEWED AT LENGTH WITH PATIENT TODAY   Patient  satisfied with Plan of action and management. All questions answered   Follow up    I spent a total  of **** minutes reviewing chart data, face-to-face evaluation with the patient, counseling and coordination of care as detailed above.      Lady Pier, M.D.  [facility]  Medical Director [facility] Medical Director [facility] Cardio-Pulmonary Department

## 2023-08-14 ENCOUNTER — Ambulatory Visit (INDEPENDENT_AMBULATORY_CARE_PROVIDER_SITE_OTHER): Payer: Medicare HMO | Admitting: Emergency Medicine

## 2023-08-14 ENCOUNTER — Encounter: Payer: Self-pay | Admitting: Emergency Medicine

## 2023-08-14 VITALS — BP 112/70 | HR 92 | Temp 98.3°F | Ht 70.0 in | Wt 210.0 lb

## 2023-08-14 DIAGNOSIS — R7303 Prediabetes: Secondary | ICD-10-CM | POA: Diagnosis not present

## 2023-08-14 DIAGNOSIS — C61 Malignant neoplasm of prostate: Secondary | ICD-10-CM | POA: Diagnosis not present

## 2023-08-14 DIAGNOSIS — J449 Chronic obstructive pulmonary disease, unspecified: Secondary | ICD-10-CM

## 2023-08-14 DIAGNOSIS — E785 Hyperlipidemia, unspecified: Secondary | ICD-10-CM | POA: Diagnosis not present

## 2023-08-14 NOTE — Progress Notes (Signed)
 Brian Hopkins 89 y.o.   Chief Complaint  Patient presents with   Follow-up    6 month f/u, patient states he's been having a cough for about month, he was given prednisone  which did not help.     HISTORY OF PRESENT ILLNESS: This is a 89 y.o. male A1A accompanied by daughter, here for follow-up of chronic medical conditions. Overall doing well.  Has no complaints or medical concerns today.  HPI   Prior to Admission medications   Medication Sig Start Date End Date Taking? Authorizing Provider  acetaminophen  (TYLENOL ) 650 MG CR tablet Take 1,300 mg by mouth every 8 (eight) hours as needed for pain.   Yes [provider]  albuterol  (VENTOLIN  HFA) 108 (90 Base) MCG/ACT inhaler INHALE 2 PUFFS INTO THE LUNGS EVERY 4 (FOUR) HOURS AS NEEDED FOR SHORTNESS OF BREATH 12/31/22  Yes Kasa, Kurian, MD  aluminum hydroxide-magnesium carbonate (GAVISCON) 95-358 MG/15ML SUSP Take by mouth as needed for heartburn or indigestion.   Yes [provider]  ANORO ELLIPTA  62.5-25 MCG/ACT AEPB TAKE 1 PUFF BY MOUTH EVERY DAY 01/13/23  Yes Kasa, Sundra Engel, MD  azelastine  (ASTELIN ) 0.1 % nasal spray Place 2 sprays into both nostrils 2 (two) times daily. Patient taking differently: Place 2 sprays into both nostrils 2 (two) times daily as needed for rhinitis or allergies. 03/29/21  Yes Kasa, Kurian, MD  azithromycin  (ZITHROMAX  Z-PAK) 250 MG tablet Take 2 tablets on Day 1 and then 1 tablet daily till gone. Patient not taking: Reported on 08/14/2023 07/20/23   Kasa, Kurian, MD  clonazePAM  (KLONOPIN ) 0.5 MG tablet Take 1 tablet (0.5 mg total) by mouth daily as needed for anxiety. 12/31/22  Yes Sanjit Mcmichael, Isidro Margo, MD  fluticasone  (FLONASE ) 50 MCG/ACT nasal spray Place 1 spray into both nostrils daily. Please schedule office visit before any future refills. 06/23/23  Yes Marc Senior, MD  Fluticasone  Furoate (ARNUITY ELLIPTA ) 200 MCG/ACT AEPB Inhale 1 puff into the lungs daily. Patient taking differently:  Inhale 1 puff into the lungs daily as needed. 09/10/22  Yes Kasa, Kurian, MD  hydrOXYzine  (ATARAX ) 25 MG tablet TAKE 1 TABLET BY MOUTH EVERY 8 HOURS AS NEEDED FOR ANXIETY 12/01/22  Yes Jazlynne Milliner, Isidro Margo, MD  Ibuprofen-Acetaminophen  (ADVIL DUAL ACTION) 125-250 MG TABS Take 2 tablets by mouth every 8 (eight) hours as needed.   Yes [provider]  ipratropium (ATROVENT ) 0.06 % nasal spray Place 2 sprays into both nostrils 4 (four) times daily. 01/29/23  Yes Cobb, Mariah Shines, NP  ipratropium-albuterol  (DUONEB) 0.5-2.5 (3) MG/3ML SOLN Inhale 3 mLs into the lungs every 6 (six) hours as needed. 07/09/22  Yes Kasa, Kurian, MD  magnesium hydroxide (MILK OF MAGNESIA) 400 MG/5ML suspension Take by mouth daily as needed for mild constipation.   Yes [provider]  Multiple Vitamin (MULTIVITAMIN) capsule Take 1 capsule by mouth daily.   Yes [provider]  Polyvinyl Alcohol-Povidone (REFRESH OP) Apply to eye as needed.   Yes [provider]  azithromycin  (ZITHROMAX ) 250 MG tablet Take 2 tablets (500 mg) on  Day 1,  followed by 1 tablet (250 mg) once daily on Days 2 through 5. Patient not taking: Reported on 08/14/2023 10/10/22   Marc Senior, MD  predniSONE  (DELTASONE ) 20 MG tablet Take 1 tablet (20 mg total) by mouth daily. 03/17/23   Kasa, Kurian, MD  predniSONE  (DELTASONE ) 20 MG tablet TAKE 1 TABLET (20 MG TOTAL) BY MOUTH DAILY WITH BREAKFAST. 10 DAYS Patient not taking: Reported on  08/14/2023 07/20/23   Cleve Dale, MD    Allergies  Allergen Reactions   Statins Other (See Comments)    myalgias   Augmentin  [Amoxicillin -Pot Clavulanate] Rash   Sulfa Antibiotics Rash    Patient Active Problem List   Diagnosis Date Noted   Prostate cancer (HCC) 02/13/2023   Vasomotor rhinitis 01/30/2023   Chronic obstructive pulmonary disease (HCC) 01/14/2022   Auditory complaints of both ears 01/14/2022   History of elevated PSA 07/11/2021   Prediabetes 07/11/2021    History of COPD 07/11/2021   Dyslipidemia 12/16/2006    Past Medical History:  Diagnosis Date   Age-related macular degeneration, dry, right eye    Age-related macular degeneration, wet, left eye (HCC)    10-09-2022  per pt eye injection's every 8 weeks   COPD (chronic obstructive pulmonary disease) (HCC)    pulmnologist--- dr Josette Nielsen;  mild copd w/ emphysema;   does not have oxygen   Decreased vision of left eye    DOE (dyspnea on exertion)    Dyslipidemia    GERD (gastroesophageal reflux disease)    Hiatal hernia    History of ulcerative colitis 2010   (10-09-2022  per pt has not had issues in several years)  previously followed by [facility] last seen 2017 (office note in care everywhere) -- left sided UC  in clinical remission ,  dx 2010 ,   History of vertebral compression fracture 2022   T8   HOH (hard of hearing)    left > right   Malignant neoplasm prostate (HCC) 07/2022   urologist--- dr Joie Narrow;  bx 04/ 2024, Gleason 4+3   Nocturia    OSA (obstructive sleep apnea) 2017   pulmonologist-- dr Cleve Dale;   stopped using cpap   PVC's (premature ventricular contractions)    Seasonal allergies     Past Surgical History:  Procedure Laterality Date   APPENDECTOMY  1957   open   CATARACT EXTRACTION W/ INTRAOCULAR LENS IMPLANT Right 2019   CHOLECYSTECTOMY OPEN  1985   COLONOSCOPY  2015   KNEE SURGERY Left 1952   per pt tumor removed, benign   ORCHIECTOMY Bilateral 10/17/2022   Procedure: SIMPLE ORCHIECTOMY;  Surgeon: Trent Frizzle, MD;  Location: [facility];  Service: Urology;  Laterality: Bilateral;  30 MINS   TONSILLECTOMY     child    Social History   Socioeconomic History   Marital status: Widowed    Spouse name: Not on file   Number of children: Not on file   Years of education: Not on file   Highest education level: Some college, no degree  Occupational History   Not on file  Tobacco Use   Smoking status: Former    Current packs/day:  0.00    Types: Cigarettes    Start date: 20    Quit date: 1    Years since quitting: 71.3   Smokeless tobacco: Never  Vaping Use   Vaping status: Never Used  Substance and Sexual Activity   Alcohol use: Not Currently   Drug use: Never   Sexual activity: Not on file  Other Topics Concern   Not on file  Social History Narrative   Not on file   Social Drivers of Health   Financial Resource Strain: Low Risk  (08/10/2023)   Overall Financial Resource Strain (CARDIA)    Difficulty of Paying Living Expenses: Not hard at all  Food Insecurity: No Food Insecurity (08/10/2023)   Hunger Vital Sign  Worried About Programme researcher, broadcasting/film/video in the Last Year: Never true    Ran Out of Food in the Last Year: Never true  Transportation Needs: No Transportation Needs (08/10/2023)   PRAPARE - Administrator, Civil Service (Medical): No    Lack of Transportation (Non-Medical): No  Physical Activity: Insufficiently Active (08/10/2023)   Exercise Vital Sign    Days of Exercise per Week: 2 days    Minutes of Exercise per Session: 10 min  Stress: No Stress Concern Present (08/10/2023)   [employer] of Occupational Health - Occupational Stress Questionnaire    Feeling of Stress : Not at all  Social Connections: Socially Isolated (08/10/2023)   Social Connection and Isolation Panel [NHANES]    Frequency of Communication with Friends and Family: Once a week    Frequency of Social Gatherings with Friends and Family: Once a week    Attends Religious Services: Never    Database administrator or Organizations: No    Attends Engineer, structural: Not on file    Marital Status: Widowed  Intimate Partner Violence: Not on file    History reviewed. No pertinent family history.   Review of Systems  Constitutional: Negative.  Negative for chills and fever.  HENT: Negative.  Negative for congestion and sore throat.   Respiratory: Negative.  Negative for cough and shortness of  breath.   Cardiovascular: Negative.  Negative for chest pain and palpitations.  Gastrointestinal:  Negative for nausea and vomiting.  Genitourinary: Negative.  Negative for dysuria and urgency.  Skin: Negative.  Negative for rash.  Neurological:  Negative for dizziness and headaches.  All other systems reviewed and are negative.   Vitals:   08/14/23 1258  BP: 112/70  Pulse: 92  Temp: 98.3 F (36.8 C)  SpO2: 96%    Physical Exam Vitals reviewed.  Constitutional:      Appearance: Normal appearance.  HENT:     Head: Normocephalic.     Mouth/Throat:     Mouth: Mucous membranes are moist.     Pharynx: Oropharynx is clear.  Eyes:     Extraocular Movements: Extraocular movements intact.     Pupils: Pupils are equal, round, and reactive to light.  Cardiovascular:     Rate and Rhythm: Normal rate and regular rhythm.     Pulses: Normal pulses.     Heart sounds: Normal heart sounds.  Pulmonary:     Effort: Pulmonary effort is normal.     Breath sounds: Normal breath sounds.  Musculoskeletal:     Cervical back: No tenderness.     Right lower leg: No edema.     Left lower leg: No edema.  Lymphadenopathy:     Cervical: No cervical adenopathy.  Skin:    General: Skin is warm and dry.     Capillary Refill: Capillary refill takes less than 2 seconds.  Neurological:     General: No focal deficit present.     Mental Status: He is alert and oriented to person, place, and time.  Psychiatric:        Mood and Affect: Mood normal.        Behavior: Behavior normal.      ASSESSMENT & PLAN: A total of 43 minutes was spent with the patient and counseling/coordination of care regarding preparing for this visit, review of most recent office visit notes, review of multiple chronic medical conditions and their management, review of all medications, review of most recent bloodwork results,  review of health maintenance items, education on nutrition, prognosis, documentation, and need for  follow up.  Problem List Items Addressed This Visit       Respiratory   Chronic obstructive pulmonary disease (HCC) - Primary   Clinically stable. Continues daily Anoro Ellipta  and albuterol  nebulizers as needed Follows up with pulmonary Dr. On a regular basis        Genitourinary   Prostate cancer (HCC)   Localized.  No signs of metastatic disease Treated with bilateral orchiectomy PSA under 1.  Recent urologist office visit notes reviewed. Doing well and tolerating symptoms of hormonal deficiency        Other   Dyslipidemia   Chronic stable condition Intolerant to statins. Diet and nutrition discussed      Prediabetes   Clinically stable. Diet and nutrition discussed.      Patient Instructions  Health Maintenance After Age 39 After age 42, you are at a higher risk for certain long-term diseases and infections as well as injuries from falls. Falls are a major cause of broken bones and head injuries in people who are older than age 9. Getting regular preventive care can help to keep you healthy and well. Preventive care includes getting regular testing and making lifestyle changes as recommended by your health care provider. Talk with your health care provider about: Which screenings and tests you should have. A screening is a test that checks for a disease when you have no symptoms. A diet and exercise plan that is right for you. What should I know about screenings and tests to prevent falls? Screening and testing are the best ways to find a health problem early. Early diagnosis and treatment give you the best chance of managing medical conditions that are common after age 57. Certain conditions and lifestyle choices may make you more likely to have a fall. Your health care provider may recommend: Regular vision checks. Poor vision and conditions such as cataracts can make you more likely to have a fall. If you wear glasses, make sure to get your prescription updated if  your vision changes. Medicine review. Work with your health care provider to regularly review all of the medicines you are taking, including over-the-counter medicines. Ask your health care provider about any side effects that may make you more likely to have a fall. Tell your health care provider if any medicines that you take make you feel dizzy or sleepy. Strength and balance checks. Your health care provider may recommend certain tests to check your strength and balance while standing, walking, or changing positions. Foot health exam. Foot pain and numbness, as well as not wearing proper footwear, can make you more likely to have a fall. Screenings, including: Osteoporosis screening. Osteoporosis is a condition that causes the bones to get weaker and break more easily. Blood pressure screening. Blood pressure changes and medicines to control blood pressure can make you feel dizzy. Depression screening. You may be more likely to have a fall if you have a fear of falling, feel depressed, or feel unable to do activities that you used to do. Alcohol use screening. Using too much alcohol can affect your balance and may make you more likely to have a fall. Follow these instructions at home: Lifestyle Do not drink alcohol if: Your health care provider tells you not to drink. If you drink alcohol: Limit how much you have to: 0-1 drink a day for women. 0-2 drinks a day for men. Know how much alcohol is  in your drink. In the [place] one drink equals one 12 oz bottle of beer (355 mL), one 5 oz glass of wine (148 mL), or one 1 oz glass of hard liquor (44 mL). Do not use any products that contain nicotine or tobacco. These products include cigarettes, chewing tobacco, and vaping devices, such as e-cigarettes. If you need help quitting, ask your health care provider. Activity  Follow a regular exercise program to stay fit. This will help you maintain your balance. Ask your health care provider what types  of exercise are appropriate for you. If you need a cane or walker, use it as recommended by your health care provider. Wear supportive shoes that have nonskid soles. Safety  Remove any tripping hazards, such as rugs, cords, and clutter. Install safety equipment such as grab bars in bathrooms and safety rails on stairs. Keep rooms and walkways well-lit. General instructions Talk with your health care provider about your risks for falling. Tell your health care provider if: You fall. Be sure to tell your health care provider about all falls, even ones that seem minor. You feel dizzy, tiredness (fatigue), or off-balance. Take over-the-counter and prescription medicines only as told by your health care provider. These include supplements. Eat a healthy diet and maintain a healthy weight. A healthy diet includes low-fat dairy products, low-fat (lean) meats, and fiber from whole grains, beans, and lots of fruits and vegetables. Stay current with your vaccines. Schedule regular health, dental, and eye exams. Summary Having a healthy lifestyle and getting preventive care can help to protect your health and wellness after age 65. Screening and testing are the best way to find a health problem early and help you avoid having a fall. Early diagnosis and treatment give you the best chance for managing medical conditions that are more common for people who are older than age 54. Falls are a major cause of broken bones and head injuries in people who are older than age 35. Take precautions to prevent a fall at home. Work with your health care provider to learn what changes you can make to improve your health and wellness and to prevent falls. This information is not intended to replace advice given to you by your health care provider. Make sure you discuss any questions you have with your health care provider. Document Revised: 08/28/2020 Document Reviewed: 08/28/2020 Elsevier Patient Education  2024  Elsevier Inc.      Maryagnes Small, MD [facility]

## 2023-08-14 NOTE — Patient Instructions (Signed)
 Health Maintenance After Age 88 After age 4, you are at a higher risk for certain long-term diseases and infections as well as injuries from falls. Falls are a major cause of broken bones and head injuries in people who are older than age 47. Getting regular preventive care can help to keep you healthy and well. Preventive care includes getting regular testing and making lifestyle changes as recommended by your health care provider. Talk with your health care provider about: Which screenings and tests you should have. A screening is a test that checks for a disease when you have no symptoms. A diet and exercise plan that is right for you. What should I know about screenings and tests to prevent falls? Screening and testing are the best ways to find a health problem early. Early diagnosis and treatment give you the best chance of managing medical conditions that are common after age 37. Certain conditions and lifestyle choices may make you more likely to have a fall. Your health care provider may recommend: Regular vision checks. Poor vision and conditions such as cataracts can make you more likely to have a fall. If you wear glasses, make sure to get your prescription updated if your vision changes. Medicine review. Work with your health care provider to regularly review all of the medicines you are taking, including over-the-counter medicines. Ask your health care provider about any side effects that may make you more likely to have a fall. Tell your health care provider if any medicines that you take make you feel dizzy or sleepy. Strength and balance checks. Your health care provider may recommend certain tests to check your strength and balance while standing, walking, or changing positions. Foot health exam. Foot pain and numbness, as well as not wearing proper footwear, can make you more likely to have a fall. Screenings, including: Osteoporosis screening. Osteoporosis is a condition that causes  the bones to get weaker and break more easily. Blood pressure screening. Blood pressure changes and medicines to control blood pressure can make you feel dizzy. Depression screening. You may be more likely to have a fall if you have a fear of falling, feel depressed, or feel unable to do activities that you used to do. Alcohol use screening. Using too much alcohol can affect your balance and may make you more likely to have a fall. Follow these instructions at home: Lifestyle Do not drink alcohol if: Your health care provider tells you not to drink. If you drink alcohol: Limit how much you have to: 0-1 drink a day for women. 0-2 drinks a day for men. Know how much alcohol is in your drink. In the [place] one drink equals one 12 oz bottle of beer (355 mL), one 5 oz glass of wine (148 mL), or one 1 oz glass of hard liquor (44 mL). Do not use any products that contain nicotine or tobacco. These products include cigarettes, chewing tobacco, and vaping devices, such as e-cigarettes. If you need help quitting, ask your health care provider. Activity  Follow a regular exercise program to stay fit. This will help you maintain your balance. Ask your health care provider what types of exercise are appropriate for you. If you need a cane or walker, use it as recommended by your health care provider. Wear supportive shoes that have nonskid soles. Safety  Remove any tripping hazards, such as rugs, cords, and clutter. Install safety equipment such as grab bars in bathrooms and safety rails on stairs. Keep rooms and walkways  well-lit. General instructions Talk with your health care provider about your risks for falling. Tell your health care provider if: You fall. Be sure to tell your health care provider about all falls, even ones that seem minor. You feel dizzy, tiredness (fatigue), or off-balance. Take over-the-counter and prescription medicines only as told by your health care provider. These include  supplements. Eat a healthy diet and maintain a healthy weight. A healthy diet includes low-fat dairy products, low-fat (lean) meats, and fiber from whole grains, beans, and lots of fruits and vegetables. Stay current with your vaccines. Schedule regular health, dental, and eye exams. Summary Having a healthy lifestyle and getting preventive care can help to protect your health and wellness after age 11. Screening and testing are the best way to find a health problem early and help you avoid having a fall. Early diagnosis and treatment give you the best chance for managing medical conditions that are more common for people who are older than age 28. Falls are a major cause of broken bones and head injuries in people who are older than age 48. Take precautions to prevent a fall at home. Work with your health care provider to learn what changes you can make to improve your health and wellness and to prevent falls. This information is not intended to replace advice given to you by your health care provider. Make sure you discuss any questions you have with your health care provider. Document Revised: 08/28/2020 Document Reviewed: 08/28/2020 Elsevier Patient Education  2024 [employer].

## 2023-08-14 NOTE — Assessment & Plan Note (Signed)
Localized.  No signs of metastatic disease Treated with bilateral orchiectomy PSA under 1.  Recent urologist office visit notes reviewed. Doing well and tolerating symptoms of hormonal deficiency.

## 2023-08-14 NOTE — Assessment & Plan Note (Signed)
 Clinically stable. Continues daily Anoro Ellipta  and albuterol  nebulizers as needed Follows up with pulmonary Dr. On a regular basis

## 2023-08-14 NOTE — Assessment & Plan Note (Signed)
Chronic stable condition Intolerant to statins Diet and nutrition discussed

## 2023-08-14 NOTE — Assessment & Plan Note (Signed)
Clinically stable. Diet and nutrition discussed.

## 2023-09-08 ENCOUNTER — Encounter: Payer: Self-pay | Admitting: Internal Medicine

## 2023-09-08 ENCOUNTER — Other Ambulatory Visit: Payer: Self-pay | Admitting: Internal Medicine

## 2023-09-08 DIAGNOSIS — J449 Chronic obstructive pulmonary disease, unspecified: Secondary | ICD-10-CM

## 2023-09-17 ENCOUNTER — Encounter: Payer: Self-pay | Admitting: Internal Medicine

## 2023-09-17 ENCOUNTER — Ambulatory Visit: Admitting: Internal Medicine

## 2023-09-17 VITALS — BP 110/76 | HR 92 | Temp 98.1°F | Ht 70.0 in | Wt 213.2 lb

## 2023-09-17 DIAGNOSIS — Z87891 Personal history of nicotine dependence: Secondary | ICD-10-CM | POA: Diagnosis not present

## 2023-09-17 DIAGNOSIS — J479 Bronchiectasis, uncomplicated: Secondary | ICD-10-CM | POA: Diagnosis not present

## 2023-09-17 DIAGNOSIS — J449 Chronic obstructive pulmonary disease, unspecified: Secondary | ICD-10-CM

## 2023-09-17 DIAGNOSIS — J441 Chronic obstructive pulmonary disease with (acute) exacerbation: Secondary | ICD-10-CM

## 2023-09-17 MED ORDER — IPRATROPIUM-ALBUTEROL 0.5-2.5 (3) MG/3ML IN SOLN: 3.0000 mL | Freq: Four times a day (QID) | RESPIRATORY_TRACT | 12 refills | Status: AC | PRN

## 2023-09-17 NOTE — Progress Notes (Signed)
 Name: Brian Hopkins MRN: [id number] DOB: 10/29/1931     CONSULTATION DATE: 09/17/2023   CHIEF COMPLAINT:  Follow up COPD assessment Patient with ongoing chronic bronchitis   HISTORY OF PRESENT ILLNESS: Ongoing symptoms of cough congestion chronic bronchitis Findings are highly suggestive of bronchiectasis At this time I recommend an aggressive regimen of incentive spirometry flutter valve and DuoNeb treatment We will plan to stop all other inhaler therapy No indication for prednisone  or antibiotics at this time  No exacerbation at this time No evidence of heart failure at this time No evidence or signs of infection at this time No respiratory distress No fevers, chills, nausea, vomiting, diarrhea No evidence of lower extremity edema No evidence hemoptysis  Regarding OSA Patient has STOPPED USING CPAP Patient had problem with the mask and too much pressure and decided not to use CPAP anymore I have explained that patient is at high risk for stroke heart problems   COPD Very mild disease in 2018 FEV1 was 90% predicted Scooping of the expiratory limbs Plan to stop all inhalers at this time  For nasal congestion Continue azelastine  2 sprays each nostril Twice daily  Continue flonase  nasal spray 2 sprays each nostril daily    PAST MEDICAL HISTORY :   has a past medical history of Age-related macular degeneration, dry, right eye, Age-related macular degeneration, wet, left eye (HCC), COPD (chronic obstructive pulmonary disease) (HCC), Decreased vision of left eye, DOE (dyspnea on exertion), Dyslipidemia, GERD (gastroesophageal reflux disease), Hiatal hernia, History of ulcerative colitis (2010), History of vertebral compression fracture (2022), HOH (hard of hearing), Malignant neoplasm prostate (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  has a past surgical history that includes  Cholecystectomy open (1985); Appendectomy (1957); Tonsillectomy; Colonoscopy (2015); Cataract extraction w/ intraocular lens implant (Right, 2019); Knee surgery (Left, 1952); and Orchiectomy (Bilateral, 10/17/2022). Prior to Admission medications   Medication Sig Start Date End Date Taking? Authorizing Provider  acetaminophen  (TYLENOL ) 650 MG CR tablet Take 1,300 mg by mouth every 8 (eight) hours as needed for pain.    [provider]  albuterol  (VENTOLIN  HFA) 108 (90 Base) MCG/ACT inhaler Inhale 2 puffs into the lungs every 4 (four) hours as needed for shortness of breath. 11/01/13   [provider]  azelastine  (ASTELIN ) 0.1 % nasal spray Place 2 sprays into the nose 2 (two) times daily as needed for congestion.    [provider]  benzonatate (TESSALON) 200 MG capsule Take 200 mg by mouth 3 (three) times daily as needed for cough. 02/27/21   [provider]  Calcium-Magnesium-Zinc 333-133-5 MG TABS Take 1 tablet by mouth daily.    [provider]  Cholecalciferol 125 MCG (5000 UT) TABS Take 5,000 Units by mouth daily.    [provider]  famotidine (PEPCID) 20 MG tablet Take 20 mg by mouth daily as needed for indigestion.    [provider]  ipratropium-albuterol  (DUONEB) 0.5-2.5 (3) MG/3ML SOLN Inhale 3 mLs into the lungs every 6 (six) hours as needed for shortness of breath or wheezing. 10/17/16   [provider]  methocarbamol (ROBAXIN) 750 MG tablet Take 750 mg by mouth in the morning, at noon, and at bedtime. 02/27/21   [provider]  mometasone -formoterol  (DULERA ) 200-5 MCG/ACT AERO Inhale 2 puffs into the lungs 2 (two) times daily. 03/13/21   Haviland, Julie, MD  polyethylene glycol powder (GLYCOLAX/MIRALAX) 17 GM/SCOOP powder Take 17 g by mouth daily as  needed for constipation. 02/27/21   [provider]  predniSONE  (STERAPRED UNI-PAK 21 TAB) 10 MG (21) TBPK tablet Take by mouth daily. Take 6 tabs by mouth  daily  for 2 days, then 5 tabs for 2 days, then 4 tabs for 2 days, then 3 tabs for 2 days, 2 tabs for 2 days, then 1 tab by mouth daily for 2 days 03/13/21   Haviland, Julie, MD  umeclidinium-vilanterol (ANORO ELLIPTA ) 62.5-25 MCG/ACT AEPB Inhale 1 puff into the lungs daily. 11/13/16   [provider]   Allergies  Allergen Reactions   Statins Other (See Comments)    myalgias   Augmentin  [Amoxicillin -Pot Clavulanate] Rash   Sulfa Antibiotics Rash    FAMILY HISTORY:  family history is not on file. SOCIAL HISTORY:  reports that he quit smoking about 71 years ago. His smoking use included cigarettes. He started smoking about 73 years ago. He has never used smokeless tobacco. He reports that he does not currently use alcohol. He reports that he does not use drugs.  BP 110/76 (BP Location: Right Arm, Patient Position: Sitting, Cuff Size: Large)   Pulse 92   Temp 98.1 F (36.7 C) (Oral)   Ht 5\' 10"  (1.778 m)   Wt 213 lb 3.2 oz (96.7 kg)   SpO2 95%   BMI 30.59 kg/m    Review of Systems: Gen:  Denies  fever, sweats, chills weight loss  HEENT: Denies blurred vision, double vision, ear pain, eye pain, hearing loss, nose bleeds, sore throat Cardiac:  No dizziness, chest pain or heaviness, chest tightness,edema, No JVD Resp:  + cough, +sputum production, -shortness of breath,-wheezing, -hemoptysis,  Other:  All other systems negative   Physical Examination:   General Appearance: No distress  EYES PERRLA, EOM intact.   NECK Supple, No JVD Pulmonary: normal breath sounds, No wheezing.  CardiovascularNormal S1,S2.  No m/r/g.   Abdomen: Benign, Soft, non-tender. Neurology UE/LE 5/5 strength, no focal deficits Ext pulses intact, cap refill intact ALL OTHER ROS ARE NEGATIVE    ASSESSMENT AND PLAN SYNOPSIS 89 year old pleasant white male seen today for follow-up assessment for underlying sleep apnea COPD chronic bronchitis with signs symptoms of bronchiectasis Patient has poor  respiratory effort and cough reflex We will need to establish breathing exercises and regimen I do not believe that traditional inhaler therapy is helpful at this time due to insufficient inspiratory capacity  Assessment of COPD Assessment of bronchiectasis Let stop all types of traditional inhaler therapy Only use albuterol  as needed For mucus clearance and pulmonary toilet-plan for following regimen  DuoNeb nebulizer in AM-use Incentive spirometry 5 times, then use flutter valve 5 times DuoNeb nebulizer in the afternoon-use incentive spirometry 5 times, use flutter valve 5 times DuoNeb nebulizer at [employer] spirometry 5 times use flutter valve 5 times  No indication for antibiotics or prednisone  at this time  For nasal congestion Continue nasal sprays as prescribed  Regarding sleep apnea Patient has stopped using his CPAP machine Risks of noncompliant explained to patient with high risk of stroke, arrhythmias Patient seems to be happy not using CPAP anymore     MEDICATION ADJUSTMENTS/LABS AND TESTS ORDERED: Please stop Anoro Please stop Arnuity Plan to help your breathing and exercises DuoNeb nebulizer in AM-use Incentive spirometry 5 times, then use flutter valve 5 times DuoNeb nebulizer in the afternoon-use incentive spirometry 5 times, use flutter valve 5 times DuoNeb nebulizer at [employer] spirometry 5 times use flutter valve 5 times Avoid Allergens and Irritants  Avoid secondhand smoke Avoid SICK contacts Recommend  Masking  when appropriate Recommend Keep up-to-date with vaccinations Continue to use nasal sprays for sinus congestion  Patient  satisfied with Plan of action and management. All questions answered  Follow-up in 3 weeks  Total time spent 48  minutes   Lady Pier, M.D.  [facility]  Medical Director [facility] Medical Director [facility] Cardio-Pulmonary Department

## 2023-09-17 NOTE — Patient Instructions (Signed)
 Please stop Anoro Please stop Arnuity  Plan to help your breathing and exercises  DuoNeb nebulizer in AM-use Incentive spirometry 5 times, then use flutter valve 5 times DuoNeb nebulizer in the afternoon-use incentive spirometry 5 times, use flutter valve 5 times DuoNeb nebulizer at [employer] spirometry 5 times use flutter valve 5 times  Avoid Allergens and Irritants Avoid secondhand smoke Avoid SICK contacts Recommend  Masking  when appropriate Recommend Keep up-to-date with vaccinations  Continue to use nasal sprays for sinus congestion

## 2023-09-25 DIAGNOSIS — H353211 Exudative age-related macular degeneration, right eye, with active choroidal neovascularization: Secondary | ICD-10-CM | POA: Diagnosis not present

## 2023-09-25 DIAGNOSIS — H353223 Exudative age-related macular degeneration, left eye, with inactive scar: Secondary | ICD-10-CM | POA: Diagnosis not present

## 2023-09-25 DIAGNOSIS — H43813 Vitreous degeneration, bilateral: Secondary | ICD-10-CM | POA: Diagnosis not present

## 2023-09-25 DIAGNOSIS — H43393 Other vitreous opacities, bilateral: Secondary | ICD-10-CM | POA: Diagnosis not present

## 2023-09-25 DIAGNOSIS — H35033 Hypertensive retinopathy, bilateral: Secondary | ICD-10-CM | POA: Diagnosis not present

## 2023-10-08 ENCOUNTER — Other Ambulatory Visit: Payer: Self-pay | Admitting: Internal Medicine

## 2023-10-08 ENCOUNTER — Encounter: Payer: Self-pay | Admitting: Internal Medicine

## 2023-10-08 DIAGNOSIS — J440 Chronic obstructive pulmonary disease with acute lower respiratory infection: Secondary | ICD-10-CM

## 2023-10-08 MED ORDER — AZITHROMYCIN 250 MG PO TABS: ORAL_TABLET | ORAL | 0 refills | Status: DC

## 2023-10-08 MED ORDER — PREDNISONE 20 MG PO TABS: 20.0000 mg | ORAL_TABLET | Freq: Every day | ORAL | 1 refills | Status: AC

## 2023-10-08 NOTE — Progress Notes (Signed)
 Pred 20 mg daily for 7 days Z pak ordered

## 2023-10-14 ENCOUNTER — Ambulatory Visit: Admitting: Internal Medicine

## 2023-10-22 ENCOUNTER — Ambulatory Visit: Payer: Medicare HMO

## 2023-10-22 VITALS — Ht 70.0 in | Wt 213.0 lb

## 2023-10-22 DIAGNOSIS — Z0000 Encounter for general adult medical examination without abnormal findings: Secondary | ICD-10-CM

## 2023-10-22 NOTE — Progress Notes (Addendum)
 Subjective:   Brian Hopkins is a 89 y.o. who presents for a Medicare Wellness preventive visit.  As a reminder, Annual Wellness Visits don't include a physical exam, and some assessments may be limited, especially if this visit is performed virtually. We may recommend an in-person follow-up visit with your provider if needed.  Visit Complete: Virtual I connected with  Brian Hopkins on 10/22/23 by a audio enabled telemedicine application and verified that I am speaking with the correct person using two identifiers.  Patient Location: Home  Provider Location: Office/Clinic  I discussed the limitations of evaluation and management by telemedicine. The patient expressed understanding and agreed to proceed.  Vital Signs: Because this visit was a virtual/telehealth visit, some criteria may be missing or patient reported. Any vitals not documented were not able to be obtained and vitals that have been documented are patient reported.  VideoDeclined- This patient declined Librarian, academic. Therefore the visit was completed with audio only.  Persons Participating in Visit: Patient assisted by Daughter, Dealer.  AWV Questionnaire: No: Patient Medicare AWV questionnaire was not completed prior to this visit.  Cardiac Risk Factors include: advanced age (>48men, >41 women);dyslipidemia     Objective:    Today's Vitals   10/22/23 1350  Weight: 213 lb (96.6 kg)  Height: 5' 10 (1.778 m)   Body mass index is 30.56 kg/m.     10/22/2023    1:50 PM 10/17/2022    9:47 AM 10/15/2022    1:37 PM 11/13/2021    5:54 PM 03/13/2021   11:34 AM  Advanced Directives  Does Patient Have a Medical Advance Directive? Yes  Yes No No  Type of Estate agent of [place];Living will Living will;Healthcare Power of [employer] Power of [place];Living will    Copy of Healthcare Power of Attorney in Chart? No - copy requested No - copy requested No  - copy requested    Would patient like information on creating a medical advance directive?    No - Patient declined No - Patient declined    Current Medications (verified) Outpatient Encounter Medications as of 10/22/2023  Medication Sig   acetaminophen  (TYLENOL ) 650 MG CR tablet Take 1,300 mg by mouth every 8 (eight) hours as needed for pain.   albuterol  (VENTOLIN  HFA) 108 (90 Base) MCG/ACT inhaler INHALE 2 PUFFS INTO THE LUNGS EVERY 4 (FOUR) HOURS AS NEEDED FOR SHORTNESS OF BREATH   aluminum hydroxide-magnesium carbonate (GAVISCON) 95-358 MG/15ML SUSP Take by mouth as needed for heartburn or indigestion.   ANORO ELLIPTA  62.5-25 MCG/ACT AEPB Inhale 1 puff into the lungs daily.   azelastine  (ASTELIN ) 0.1 % nasal spray Place 2 sprays into both nostrils 2 (two) times daily.   azithromycin  (ZITHROMAX  Z-PAK) 250 MG tablet Take 2 tablets on Day 1 and then 1 tablet daily till gone. (Patient not taking: Reported on 10/22/2023)   clonazePAM  (KLONOPIN ) 0.5 MG tablet Take 1 tablet (0.5 mg total) by mouth daily as needed for anxiety.   fluticasone  (FLONASE ) 50 MCG/ACT nasal spray Place 1 spray into both nostrils daily. Please schedule office visit before any future refills.   Fluticasone  Furoate (ARNUITY ELLIPTA ) 200 MCG/ACT AEPB Inhale 1 puff into the lungs daily.   hydrOXYzine  (ATARAX ) 25 MG tablet TAKE 1 TABLET BY MOUTH EVERY 8 HOURS AS NEEDED FOR ANXIETY   Ibuprofen-Acetaminophen  (ADVIL DUAL ACTION) 125-250 MG TABS Take 2 tablets by mouth every 8 (eight) hours as needed.   ipratropium (ATROVENT ) 0.06 %  nasal spray Place 2 sprays into both nostrils 4 (four) times daily.   ipratropium-albuterol  (DUONEB) 0.5-2.5 (3) MG/3ML SOLN Inhale 3 mLs into the lungs every 6 (six) hours as needed.   magnesium hydroxide (MILK OF MAGNESIA) 400 MG/5ML suspension Take by mouth daily as needed for mild constipation.   Multiple Vitamin (MULTIVITAMIN) capsule Take 1 capsule by mouth daily.   Polyvinyl Alcohol-Povidone  (REFRESH OP) Apply to eye as needed.   azithromycin  (ZITHROMAX  Z-PAK) 250 MG tablet Take 2 tablets on Day 1 and then 1 tablet daily till gone.   azithromycin  (ZITHROMAX ) 250 MG tablet Take 2 tablets (500 mg) on  Day 1,  followed by 1 tablet (250 mg) once daily on Days 2 through 5. (Patient not taking: Reported on 10/22/2023)   predniSONE  (DELTASONE ) 20 MG tablet TAKE 1 TABLET (20 MG TOTAL) BY MOUTH DAILY WITH BREAKFAST. 10 DAYS (Patient not taking: Reported on 10/22/2023)   predniSONE  (DELTASONE ) 20 MG tablet Take 1 tablet (20 mg total) by mouth daily with breakfast. 7 days (Patient not taking: Reported on 10/22/2023)   No facility-administered encounter medications on file as of 10/22/2023.    Allergies (verified) Statins, Augmentin  [amoxicillin -pot clavulanate], and Sulfa antibiotics   History: Past Medical History:  Diagnosis Date   Age-related macular degeneration, dry, right eye    Age-related macular degeneration, wet, left eye (HCC)    10-09-2022  per pt eye injection's every 8 weeks   COPD (chronic obstructive pulmonary disease) (HCC)    pulmnologist--- dr lois cellar;  mild copd w/ emphysema;   does not have oxygen   Decreased vision of left eye    DOE (dyspnea on exertion)    Dyslipidemia    GERD (gastroesophageal reflux disease)    Hiatal hernia    History of ulcerative colitis 2010   (10-09-2022  per pt has not had issues in several years)  previously followed by [facility] last seen 2017 (office note in care everywhere) -- left sided UC  in clinical remission ,  dx 2010 ,   History of vertebral compression fracture 2022   T8   HOH (hard of hearing)    left > right   Malignant neoplasm prostate (HCC) 07/2022   urologist--- dr matilda;  bx 04/ 2024, Gleason 4+3   Nocturia    OSA (obstructive sleep apnea) 2017   pulmonologist-- dr nickolas cellar;   stopped using cpap   PVC's (premature ventricular contractions)    Seasonal allergies    Past Surgical History:  Procedure Laterality  Date   APPENDECTOMY  1957   open   CATARACT EXTRACTION W/ INTRAOCULAR LENS IMPLANT Right 2019   CHOLECYSTECTOMY OPEN  1985   COLONOSCOPY  2015   KNEE SURGERY Left 1952   per pt tumor removed, benign   ORCHIECTOMY Bilateral 10/17/2022   Procedure: SIMPLE ORCHIECTOMY;  Surgeon: matilda Senior, MD;  Location: [facility];  Service: Urology;  Laterality: Bilateral;  30 MINS   TONSILLECTOMY     child   History reviewed. No pertinent family history. Social History   Socioeconomic History   Marital status: Widowed    Spouse name: Not on file   Number of children: Not on file   Years of education: Not on file   Highest education level: Some college, no degree  Occupational History   Not on file  Tobacco Use   Smoking status: Former    Current packs/day: 0.00    Types: Cigarettes    Start date: 84  Quit date: 13    Years since quitting: 71.5   Smokeless tobacco: Never  Vaping Use   Vaping status: Never Used  Substance and Sexual Activity   Alcohol use: Not Currently   Drug use: Never   Sexual activity: Not Currently  Other Topics Concern   Not on file  Social History Narrative   Widowed   Social Drivers of Health   Financial Resource Strain: Low Risk  (10/22/2023)   Overall Financial Resource Strain (CARDIA)    Difficulty of Paying Living Expenses: Not hard at all  Food Insecurity: No Food Insecurity (10/22/2023)   Hunger Vital Sign    Worried About Running Out of Food in the Last Year: Never true    Ran Out of Food in the Last Year: Never true  Transportation Needs: No Transportation Needs (10/22/2023)   PRAPARE - Administrator, Civil Service (Medical): No    Lack of Transportation (Non-Medical): No  Physical Activity: Insufficiently Active (10/22/2023)   Exercise Vital Sign    Days of Exercise per Week: 3 days    Minutes of Exercise per Session: 10 min  Stress: No Stress Concern Present (10/22/2023)   [employer] of Occupational  Health - Occupational Stress Questionnaire    Feeling of Stress: Not at all  Social Connections: Socially Isolated (10/22/2023)   Social Connection and Isolation Panel    Frequency of Communication with Friends and Family: Once a week    Frequency of Social Gatherings with Friends and Family: Once a week    Attends Religious Services: Never    Database administrator or Organizations: No    Attends Banker Meetings: Never    Marital Status: Widowed    Tobacco Counseling Counseling given: No    Clinical Intake:  Pre-visit preparation completed: Yes  Pain : No/denies pain     BMI - recorded: 30.56 Nutritional Status: BMI > 30  Obese Nutritional Risks: None Diabetes: No  Lab Results  Component Value Date   HGBA1C 5.9 07/11/2021     How often do you need to have someone help you when you read instructions, pamphlets, or other written materials from your doctor or pharmacy?: 4 - Often (Dtr assists)  Interpreter Needed?: No  Information entered by :: Verdie Saba, CMA   Activities of Daily Living     10/22/2023    1:55 PM  In your present state of health, do you have any difficulty performing the following activities:  Hearing? 0  Vision? 0  Difficulty concentrating or making decisions? 0  Walking or climbing stairs? 0  Dressing or bathing? 0  Doing errands, shopping? 0  Preparing Food and eating ? N  Using the Toilet? N  In the past six months, have you accidently leaked urine? Y  Comment wears depends  Do you have problems with loss of bowel control? N  Managing your Medications? N  Managing your Finances? N  Housekeeping or managing your Housekeeping? N    Patient Care Team: Purcell Emil Schanz, MD as PCP - General (Internal Medicine) Raj Rankin SAUNDERS, MD as Referring Physician (Ophthalmology)  I have updated your Care Teams any recent Medical Services you may have received from other providers in the past year.     Assessment:   This is  a routine wellness examination for Brian Hopkins.  Hearing/Vision screen Hearing Screening - Comments:: Denies hearing difficulties   Vision Screening - Comments:: Wears rx glasses - up to date with routine eye  exams with [facility]   Goals Addressed               This Visit's Progress     Patient Stated (pt-stated)        Patient stated he continues to eat healthy       Depression Screen     10/22/2023    1:58 PM 08/14/2023    1:01 PM 02/13/2023    1:07 PM 10/15/2022    1:42 PM 07/22/2022   10:48 AM 01/14/2022   10:22 AM 12/18/2021    3:41 PM  PHQ 2/9 Scores  PHQ - 2 Score 0 0 0 0 0 0 0  PHQ- 9 Score 2   0       Fall Risk     10/22/2023    1:57 PM 08/14/2023    1:01 PM 02/13/2023    1:07 PM 10/17/2022   10:07 AM 07/22/2022   10:48 AM  Fall Risk   Falls in the past year?  0 1 0 0  Number falls in past yr: 0 0 0 0 0  Injury with Fall? 0 0 0 0 0  Risk for fall due to : Impaired balance/gait;History of fall(s) No Fall Risks Impaired balance/gait No Fall Risks No Fall Risks  Follow up Falls evaluation completed;Falls prevention discussed Falls evaluation completed Falls evaluation completed Falls prevention discussed Falls evaluation completed    MEDICARE RISK AT HOME:  Medicare Risk at Home Any stairs in or around the home?: No If so, are there any without handrails?: No Home free of loose throw rugs in walkways, pet beds, electrical cords, etc?: Yes Adequate lighting in your home to reduce risk of falls?: Yes Life alert?: No Use of a cane, walker or w/c?: Yes (cane/walker) Grab bars in the bathroom?: Yes Shower chair or bench in shower?: Yes Elevated toilet seat or a handicapped toilet?: Yes  TIMED UP AND GO:  Was the test performed?  No  Cognitive Function: 6CIT completed        10/22/2023    2:01 PM 10/15/2022    1:42 PM  6CIT Screen  What Year? 0 points 0 points  What month? 0 points 0 points  What time? 0 points 0 points  Count back from 20 0 points 0  points  Months in reverse 4 points 0 points  Repeat phrase 0 points 0 points  Total Score 4 points 0 points    Immunizations Immunization History  Administered Date(s) Administered   Fluad Quad(high Dose 65+) 01/14/2022   Fluad Trivalent(High Dose 65+) 01/04/2023   H1N1 12/12/2014   Influenza Whole 04/22/2005, 01/23/2007   Influenza, High Dose Seasonal PF 01/04/2014, 12/20/2016, 12/25/2017, 12/10/2018, 12/10/2018   Influenza-Unspecified 01/08/2009, 12/22/2012   Moderna Sars-Covid-2 Vaccination 01/04/2023   PFIZER Comirnaty(Gray Top)Covid-19 Tri-Sucrose Vaccine 05/28/2019, 06/19/2019, 01/26/2020, 08/17/2020   Pneumococcal Conjugate-13 08/23/2013, 08/24/2013, 05/17/2016   Pneumococcal Polysaccharide-23 04/22/2000, 12/15/2005   Respiratory Syncytial Virus Vaccine,Recomb Aduvanted(Arexvy) 02/27/2022   Td 04/22/1996, 01/18/2007   Td (Adult),5 Lf Tetanus Toxid, Preservative Free 04/22/1996, 01/18/2007   Tdap 04/22/1996, 12/15/2005, 01/18/2007, 10/25/2017, 10/25/2018, 01/14/2023   Zoster Recombinant(Shingrix) 07/28/2017, 10/24/2017, 10/25/2017   Zoster, Live 11/14/2010    Screening Tests Health Maintenance  Topic Date Due   COVID-19 Vaccine (6 - 2024-25 season) 03/01/2023   INFLUENZA VACCINE  11/21/2023   Medicare Annual Wellness (AWV)  10/21/2024   DTaP/Tdap/Td (11 - Td or Tdap) 01/13/2033   Pneumococcal Vaccine: 50+ Years  Completed   Zoster Vaccines- Shingrix  Completed   Hepatitis B Vaccines  Aged Out   HPV VACCINES  Aged Out   Meningococcal B Vaccine  Aged Out    Health Maintenance  Health Maintenance Due  Topic Date Due   COVID-19 Vaccine (6 - 2024-25 season) 03/01/2023   Health Maintenance Items Addressed:10/22/2023   Additional Screening:  Vision Screening: Recommended annual ophthalmology exams for early detection of glaucoma and other disorders of the eye. Would you like a referral to an eye doctor? No    Dental Screening: Recommended annual dental exams for  proper oral hygiene  Community Resource Referral / Chronic Care Management: CRR required this visit?  No   CCM required this visit?  No   Plan:    I have personally reviewed and noted the following in the patient's chart:   Medical and social history Use of alcohol, tobacco or illicit drugs  Current medications and supplements including opioid prescriptions. Patient is not currently taking opioid prescriptions. Functional ability and status Nutritional status Physical activity Advanced directives List of other physicians Hospitalizations, surgeries, and ER visits in previous 12 months Vitals Screenings to include cognitive, depression, and falls Referrals and appointments  In addition, I have reviewed and discussed with patient certain preventive protocols, quality metrics, and best practice recommendations. A written personalized care plan for preventive services as well as general preventive health recommendations were provided to patient.   Verdie CHRISTELLA Saba, CMA   10/22/2023   After Visit Summary: (MyChart) Due to this being a telephonic visit, the after visit summary with patients personalized plan was offered to patient via MyChart   Notes: Nothing significant to report at this time.

## 2023-10-22 NOTE — Patient Instructions (Addendum)
 [name] , Thank you for taking time out of your busy schedule to complete your Annual Wellness Visit with me. I enjoyed our conversation and look forward to speaking with you again next year. I, as well as your care team,  appreciate your ongoing commitment to your health goals. Please review the following plan we discussed and let me know if I can assist you in the future. Your Game plan/ To Do List     Follow up Visits: Next Medicare AWV with our clinical staff: 10/25/2024   Have you seen your provider in the last 6 months (3 months if uncontrolled diabetes)? Yes Next Office Visit with your provider: 02/16/2024  Clinician Recommendations:  Aim for 30 minutes of exercise or brisk walking, 6-8 glasses of water , and 5 servings of fruits and vegetables each day.       This is a list of the screening recommended for you and due dates:  Health Maintenance  Topic Date Due   COVID-19 Vaccine (6 - 2024-25 season) 03/01/2023   Flu Shot  11/21/2023   Medicare Annual Wellness Visit  10/21/2024   DTaP/Tdap/Td vaccine (11 - Td or Tdap) 01/13/2033   Pneumococcal Vaccine for age over 23  Completed   Zoster (Shingles) Vaccine  Completed   Hepatitis B Vaccine  Aged Out   HPV Vaccine  Aged Out   Meningitis B Vaccine  Aged Out    Advanced directives: (Copy Requested) Please bring a copy of your health care power of attorney and living will to the office to be added to your chart at your convenience. You can mail to [facility] [address] [place], [place] [address] or email to [email or web address] Advance Care Planning is important because it:  [x]  Makes sure you receive the medical care that is consistent with your values, goals, and preferences  [x]  It provides guidance to your family and loved ones and reduces their decisional burden about whether or not they are making the right decisions based on your wishes.  Follow the link provided in your after visit summary or read  over the paperwork we have mailed to you to help you started getting your Advance Directives in place. If you need assistance in completing these, please reach out to us  so that we can help you!

## 2023-11-16 ENCOUNTER — Encounter: Payer: Self-pay | Admitting: Internal Medicine

## 2023-11-17 MED ORDER — AZITHROMYCIN 250 MG PO TABS: ORAL_TABLET | ORAL | 0 refills | Status: AC

## 2023-11-17 NOTE — Telephone Encounter (Signed)
 Reviewed the patient's records.  Have sent prescription for Azithromycin .  He needs to make an appointment for follow-up with Dr. Kasa as may need to make some adjustments to his medications.

## 2023-11-20 ENCOUNTER — Encounter: Payer: Self-pay | Admitting: Internal Medicine

## 2023-11-20 DIAGNOSIS — J441 Chronic obstructive pulmonary disease with (acute) exacerbation: Secondary | ICD-10-CM

## 2023-11-20 DIAGNOSIS — H353223 Exudative age-related macular degeneration, left eye, with inactive scar: Secondary | ICD-10-CM | POA: Diagnosis not present

## 2023-11-20 DIAGNOSIS — H43813 Vitreous degeneration, bilateral: Secondary | ICD-10-CM | POA: Diagnosis not present

## 2023-11-20 DIAGNOSIS — J209 Acute bronchitis, unspecified: Secondary | ICD-10-CM

## 2023-11-20 DIAGNOSIS — H35033 Hypertensive retinopathy, bilateral: Secondary | ICD-10-CM | POA: Diagnosis not present

## 2023-11-20 DIAGNOSIS — H353211 Exudative age-related macular degeneration, right eye, with active choroidal neovascularization: Secondary | ICD-10-CM | POA: Diagnosis not present

## 2023-11-20 DIAGNOSIS — H43393 Other vitreous opacities, bilateral: Secondary | ICD-10-CM | POA: Diagnosis not present

## 2023-11-20 DIAGNOSIS — H353113 Nonexudative age-related macular degeneration, right eye, advanced atrophic without subfoveal involvement: Secondary | ICD-10-CM | POA: Diagnosis not present

## 2023-11-21 MED ORDER — ARNUITY ELLIPTA 200 MCG/ACT IN AEPB: 1.0000 | INHALATION_SPRAY | Freq: Every day | RESPIRATORY_TRACT | 5 refills | Status: DC

## 2023-12-11 ENCOUNTER — Encounter: Payer: Self-pay | Admitting: Internal Medicine

## 2023-12-11 ENCOUNTER — Ambulatory Visit: Admitting: Internal Medicine

## 2023-12-11 VITALS — BP 120/80 | HR 85 | Temp 97.9°F | Ht 63.0 in | Wt 215.0 lb

## 2023-12-11 DIAGNOSIS — J470 Bronchiectasis with acute lower respiratory infection: Secondary | ICD-10-CM

## 2023-12-11 DIAGNOSIS — J42 Unspecified chronic bronchitis: Secondary | ICD-10-CM | POA: Diagnosis not present

## 2023-12-11 DIAGNOSIS — G4733 Obstructive sleep apnea (adult) (pediatric): Secondary | ICD-10-CM

## 2023-12-11 DIAGNOSIS — R0602 Shortness of breath: Secondary | ICD-10-CM | POA: Diagnosis not present

## 2023-12-11 DIAGNOSIS — R0689 Other abnormalities of breathing: Secondary | ICD-10-CM | POA: Diagnosis not present

## 2023-12-11 MED ORDER — AZITHROMYCIN 250 MG PO TABS: ORAL_TABLET | ORAL | 0 refills | Status: AC

## 2023-12-11 MED ORDER — PREDNISONE 20 MG PO TABS: 20.0000 mg | ORAL_TABLET | Freq: Every day | ORAL | 0 refills | Status: AC

## 2023-12-11 MED ORDER — ROFLUMILAST 250 MCG PO TABS: 250.0000 ug | ORAL_TABLET | Freq: Once | ORAL | 5 refills | Status: DC

## 2023-12-11 NOTE — Patient Instructions (Addendum)
 Prednisone  20 mg daily for 7 days  Z-Pak  Plan to start Daliresp  250 mg one tablet daily  Continue inhalers and nebulizer as prescribed  Avoid Allergens and Irritants Avoid secondhand smoke Avoid SICK contacts Recommend  Masking  when appropriate Recommend Keep up-to-date with vaccinations

## 2023-12-11 NOTE — Progress Notes (Unsigned)
 Name: Brian Hopkins MRN: [id number] DOB: [birth year]     CONSULTATION DATE: 12/11/2023   CHIEF COMPLAINT:  Follow up COPD assessment Patient with ongoing chronic bronchitis   HISTORY OF PRESENT ILLNESS: Ongoing symptoms of cough congestion chronic bronchitis Findings are highly suggestive of bronchiectasis At this time I recommend an aggressive regimen of incentive spirometry flutter valve and DuoNeb treatment We will plan to stop all other inhaler therapy No indication for prednisone  or antibiotics at this time  No exacerbation at this time No evidence of heart failure at this time No evidence or signs of infection at this time No respiratory distress No fevers, chills, nausea, vomiting, diarrhea No evidence of lower extremity edema No evidence hemoptysis  Regarding OSA Patient has STOPPED USING CPAP Patient had problem with the mask and too much pressure and decided not to use CPAP anymore I have explained that patient is at high risk for stroke heart problems   COPD Very mild disease in 2018 FEV1 was 90% predicted Scooping of the expiratory limbs Plan to stop all inhalers at this time  For nasal congestion Continue azelastine  2 sprays each nostril Twice daily  Continue flonase  nasal spray 2 sprays each nostril daily    PAST MEDICAL HISTORY :   has a past medical history of Age-related macular degeneration, dry, right eye, Age-related macular degeneration, wet, left eye (HCC), COPD (chronic obstructive pulmonary disease) (HCC), Decreased vision of left eye, DOE (dyspnea on exertion), Dyslipidemia, GERD (gastroesophageal reflux disease), Hiatal hernia, History of ulcerative colitis (2010), History of vertebral compression fracture (2022), HOH (hard of hearing), Malignant neoplasm prostate (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  has a past surgical history that includes  Cholecystectomy open (1985); Appendectomy (1957); Tonsillectomy; Colonoscopy (2015); Cataract extraction w/ intraocular lens implant (Right, 2019); Knee surgery (Left, 1952); and Orchiectomy (Bilateral, 10/17/2022). Prior to Admission medications   Medication Sig Start Date End Date Taking? Authorizing Provider  acetaminophen  (TYLENOL ) 650 MG CR tablet Take 1,300 mg by mouth every 8 (eight) hours as needed for pain.    [provider]  albuterol  (VENTOLIN  HFA) 108 (90 Base) MCG/ACT inhaler Inhale 2 puffs into the lungs every 4 (four) hours as needed for shortness of breath. 11/01/13   [provider]  azelastine  (ASTELIN ) 0.1 % nasal spray Place 2 sprays into the nose 2 (two) times daily as needed for congestion.    [provider]  benzonatate (TESSALON) 200 MG capsule Take 200 mg by mouth 3 (three) times daily as needed for cough. 02/27/21   [provider]  Calcium-Magnesium-Zinc 333-133-5 MG TABS Take 1 tablet by mouth daily.    [provider]  Cholecalciferol 125 MCG (5000 UT) TABS Take 5,000 Units by mouth daily.    [provider]  famotidine (PEPCID) 20 MG tablet Take 20 mg by mouth daily as needed for indigestion.    [provider]  ipratropium-albuterol  (DUONEB) 0.5-2.5 (3) MG/3ML SOLN Inhale 3 mLs into the lungs every 6 (six) hours as needed for shortness of breath or wheezing. 10/17/16   [provider]  methocarbamol (ROBAXIN) 750 MG tablet Take 750 mg by mouth in the morning, at noon, and at bedtime. 02/27/21   [provider]  mometasone -formoterol  (DULERA ) 200-5 MCG/ACT AERO Inhale 2 puffs into the lungs 2 (two) times daily. 03/13/21   Haviland, Julie, MD  polyethylene glycol powder (GLYCOLAX/MIRALAX) 17 GM/SCOOP powder Take 17 g by mouth daily as  needed for constipation. 02/27/21   [provider]  predniSONE  (STERAPRED UNI-PAK 21 TAB) 10 MG (21) TBPK tablet Take by mouth daily. Take 6 tabs by mouth  daily  for 2 days, then 5 tabs for 2 days, then 4 tabs for 2 days, then 3 tabs for 2 days, 2 tabs for 2 days, then 1 tab by mouth daily for 2 days 03/13/21   Haviland, Julie, MD  umeclidinium-vilanterol (ANORO ELLIPTA ) 62.5-25 MCG/ACT AEPB Inhale 1 puff into the lungs daily. 11/13/16   [provider]   Allergies  Allergen Reactions   Statins Other (See Comments)    myalgias   Augmentin  [Amoxicillin -Pot Clavulanate] Rash   Sulfa Antibiotics Rash    FAMILY HISTORY:  family history is not on file. SOCIAL HISTORY:  reports that he quit smoking about 71 years ago. His smoking use included cigarettes. He started smoking about 73 years ago. He has never used smokeless tobacco. He reports that he does not currently use alcohol. He reports that he does not use drugs.  There were no vitals taken for this visit.   Review of Systems: Gen:  Denies  fever, sweats, chills weight loss  HEENT: Denies blurred vision, double vision, ear pain, eye pain, hearing loss, nose bleeds, sore throat Cardiac:  No dizziness, chest pain or heaviness, chest tightness,edema, No JVD Resp:  + cough, +sputum production, -shortness of breath,-wheezing, -hemoptysis,  Other:  All other systems negative   Physical Examination:   General Appearance: No distress  EYES PERRLA, EOM intact.   NECK Supple, No JVD Pulmonary: normal breath sounds, No wheezing.  CardiovascularNormal S1,S2.  No m/r/g.   Abdomen: Benign, Soft, non-tender. Neurology UE/LE 5/5 strength, no focal deficits Ext pulses intact, cap refill intact ALL OTHER ROS ARE NEGATIVE    ASSESSMENT AND PLAN SYNOPSIS 89 year old pleasant white male seen today for follow-up assessment for underlying sleep apnea COPD chronic bronchitis with signs symptoms of bronchiectasis Patient has poor respiratory effort and cough reflex We will need to establish breathing exercises and regimen I do not believe that traditional inhaler therapy is helpful at this  time due to insufficient inspiratory capacity  Assessment of COPD Assessment of bronchiectasis Let stop all types of traditional inhaler therapy Only use albuterol  as needed For mucus clearance and pulmonary toilet-plan for following regimen  DuoNeb nebulizer in AM-use Incentive spirometry 5 times, then use flutter valve 5 times DuoNeb nebulizer in the afternoon-use incentive spirometry 5 times, use flutter valve 5 times DuoNeb nebulizer at [employer] spirometry 5 times use flutter valve 5 times  No indication for antibiotics or prednisone  at this time  For nasal congestion Continue nasal sprays as prescribed  Regarding sleep apnea Patient has stopped using his CPAP machine Risks of noncompliant explained to patient with high risk of stroke, arrhythmias Patient seems to be happy not using CPAP anymore     MEDICATION ADJUSTMENTS/LABS AND TESTS ORDERED: Please stop Anoro Please stop Arnuity Plan to help your breathing and exercises DuoNeb nebulizer in AM-use Incentive spirometry 5 times, then use flutter valve 5 times DuoNeb nebulizer in the afternoon-use incentive spirometry 5 times, use flutter valve 5 times DuoNeb nebulizer at [employer] spirometry 5 times use flutter valve 5 times Avoid Allergens and Irritants Avoid secondhand smoke Avoid SICK contacts Recommend  Masking  when appropriate Recommend Keep up-to-date with vaccinations Continue to use nasal sprays for sinus congestion  Patient  satisfied with Plan of action and management. All questions answered  Follow-up in 3  weeks  Total time spent 48  minutes   Nickolas Alm Cellar, M.D.  [facility]  Medical Director [facility] Medical Director [facility] Cardio-Pulmonary Department

## 2023-12-12 NOTE — Assessment & Plan Note (Signed)
 Assessment of COPD Assessment of bronchiectasis Only use albuterol  as needed For mucus clearance and pulmonary toilet-plan for following regimen DuoNeb nebulizer in AM-use Incentive spirometry 5 times, then use flutter valve 5 times DuoNeb nebulizer in the afternoon-use incentive spirometry 5 times, use flutter valve 5 times DuoNeb nebulizer at [employer] spirometry 5 times use flutter valve 5 times

## 2023-12-24 ENCOUNTER — Encounter: Payer: Self-pay | Admitting: Internal Medicine

## 2024-01-13 ENCOUNTER — Other Ambulatory Visit: Payer: Self-pay | Admitting: Internal Medicine

## 2024-01-13 DIAGNOSIS — J449 Chronic obstructive pulmonary disease, unspecified: Secondary | ICD-10-CM

## 2024-01-13 DIAGNOSIS — G4719 Other hypersomnia: Secondary | ICD-10-CM

## 2024-01-15 DIAGNOSIS — H35033 Hypertensive retinopathy, bilateral: Secondary | ICD-10-CM | POA: Diagnosis not present

## 2024-01-15 DIAGNOSIS — H43393 Other vitreous opacities, bilateral: Secondary | ICD-10-CM | POA: Diagnosis not present

## 2024-01-15 DIAGNOSIS — H353223 Exudative age-related macular degeneration, left eye, with inactive scar: Secondary | ICD-10-CM | POA: Diagnosis not present

## 2024-01-15 DIAGNOSIS — H353113 Nonexudative age-related macular degeneration, right eye, advanced atrophic without subfoveal involvement: Secondary | ICD-10-CM | POA: Diagnosis not present

## 2024-01-15 DIAGNOSIS — H353211 Exudative age-related macular degeneration, right eye, with active choroidal neovascularization: Secondary | ICD-10-CM | POA: Diagnosis not present

## 2024-01-15 DIAGNOSIS — H43813 Vitreous degeneration, bilateral: Secondary | ICD-10-CM | POA: Diagnosis not present

## 2024-01-21 ENCOUNTER — Encounter: Payer: Self-pay | Admitting: Internal Medicine

## 2024-01-22 ENCOUNTER — Other Ambulatory Visit: Payer: Self-pay | Admitting: Internal Medicine

## 2024-01-22 ENCOUNTER — Other Ambulatory Visit: Payer: Self-pay | Admitting: Pulmonary Disease

## 2024-01-22 DIAGNOSIS — R062 Wheezing: Secondary | ICD-10-CM

## 2024-01-22 DIAGNOSIS — J449 Chronic obstructive pulmonary disease, unspecified: Secondary | ICD-10-CM

## 2024-01-22 MED ORDER — ALBUTEROL SULFATE (2.5 MG/3ML) 0.083% IN NEBU: 2.5000 mg | INHALATION_SOLUTION | RESPIRATORY_TRACT | 2 refills | Status: DC | PRN

## 2024-01-22 MED ORDER — PREDNISONE 20 MG PO TABS: 20.0000 mg | ORAL_TABLET | Freq: Every day | ORAL | 3 refills | Status: AC

## 2024-01-22 NOTE — Progress Notes (Signed)
 PRED and NEBS for wheezing ordered

## 2024-01-29 DIAGNOSIS — C61 Malignant neoplasm of prostate: Secondary | ICD-10-CM | POA: Diagnosis not present

## 2024-02-06 MED ORDER — ALBUTEROL SULFATE (2.5 MG/3ML) 0.083% IN NEBU: 2.5000 mg | INHALATION_SOLUTION | RESPIRATORY_TRACT | 11 refills | Status: AC | PRN

## 2024-02-06 NOTE — Addendum Note (Signed)
 Addended by: VICCI EVALENE DEL on: 02/06/2024 08:22 AM   Modules accepted: Orders

## 2024-02-16 ENCOUNTER — Ambulatory Visit: Admitting: Emergency Medicine

## 2024-02-26 ENCOUNTER — Encounter: Payer: Self-pay | Admitting: Emergency Medicine

## 2024-02-26 ENCOUNTER — Ambulatory Visit: Payer: Self-pay | Admitting: Emergency Medicine

## 2024-02-26 ENCOUNTER — Ambulatory Visit: Admitting: Emergency Medicine

## 2024-02-26 VITALS — BP 118/80 | HR 76 | Temp 97.9°F | Ht 63.0 in | Wt 207.0 lb

## 2024-02-26 DIAGNOSIS — Z6836 Body mass index (BMI) 36.0-36.9, adult: Secondary | ICD-10-CM | POA: Diagnosis not present

## 2024-02-26 DIAGNOSIS — R7303 Prediabetes: Secondary | ICD-10-CM | POA: Diagnosis not present

## 2024-02-26 DIAGNOSIS — H353290 Exudative age-related macular degeneration, unspecified eye, stage unspecified: Secondary | ICD-10-CM

## 2024-02-26 DIAGNOSIS — J449 Chronic obstructive pulmonary disease, unspecified: Secondary | ICD-10-CM | POA: Diagnosis not present

## 2024-02-26 DIAGNOSIS — E785 Hyperlipidemia, unspecified: Secondary | ICD-10-CM | POA: Diagnosis not present

## 2024-02-26 LAB — LIPID PANEL
Cholesterol: 238 mg/dL — ABNORMAL HIGH (ref 0–200)
HDL: 79.7 mg/dL (ref >39.00)
LDL Cholesterol: 125 mg/dL — ABNORMAL HIGH (ref 0–99)
NonHDL: 158.18
Total CHOL/HDL Ratio: 3
Triglycerides: 168 mg/dL — ABNORMAL HIGH (ref 0.0–149.0)
VLDL: 33.6 mg/dL (ref 0.0–40.0)

## 2024-02-26 LAB — COMPREHENSIVE METABOLIC PANEL WITH GFR
ALT: 18 U/L (ref 0–53)
AST: 18 U/L (ref 0–37)
Albumin: 4.2 g/dL (ref 3.5–5.2)
Alkaline Phosphatase: 69 U/L (ref 39–117)
BUN: 28 mg/dL — ABNORMAL HIGH (ref 6–23)
CO2: 25 meq/L (ref 19–32)
Calcium: 9.4 mg/dL (ref 8.4–10.5)
Chloride: 105 meq/L (ref 96–112)
Creatinine, Ser: 1.03 mg/dL (ref 0.40–1.50)
GFR: 63.83 mL/min (ref >60.00)
Glucose, Bld: 91 mg/dL (ref 70–99)
Potassium: 3.8 meq/L (ref 3.5–5.1)
Sodium: 139 meq/L (ref 135–145)
Total Bilirubin: 0.4 mg/dL (ref 0.2–1.2)
Total Protein: 6.8 g/dL (ref 6.0–8.3)

## 2024-02-26 LAB — CBC WITH DIFFERENTIAL/PLATELET
Basophils Absolute: 0.1 K/uL (ref 0.0–0.1)
Basophils Relative: 0.5 % (ref 0.0–3.0)
Eosinophils Absolute: 0.1 K/uL (ref 0.0–0.7)
Eosinophils Relative: 0.7 % (ref 0.0–5.0)
HCT: 42 % (ref 39.0–52.0)
Hemoglobin: 13.8 g/dL (ref 13.0–17.0)
Lymphocytes Relative: 16.6 % (ref 12.0–46.0)
Lymphs Abs: 2.3 K/uL (ref 0.7–4.0)
MCHC: 32.8 g/dL (ref 30.0–36.0)
MCV: 87.6 fl (ref 78.0–100.0)
Monocytes Absolute: 0.7 K/uL (ref 0.1–1.0)
Monocytes Relative: 4.8 % (ref 3.0–12.0)
Neutro Abs: 10.7 K/uL — ABNORMAL HIGH (ref 1.4–7.7)
Neutrophils Relative %: 77.4 % — ABNORMAL HIGH (ref 43.0–77.0)
Platelets: 354 K/uL (ref 150.0–400.0)
RBC: 4.79 Mil/uL (ref 4.22–5.81)
RDW: 13.4 % (ref 11.5–15.5)
WBC: 13.8 K/uL — ABNORMAL HIGH (ref 4.0–10.5)

## 2024-02-26 LAB — HEMOGLOBIN A1C: Hgb A1c MFr Bld: 5.9 % (ref 4.6–6.5)

## 2024-02-26 NOTE — Assessment & Plan Note (Signed)
Chronic stable condition Intolerant to statins Diet and nutrition discussed

## 2024-02-26 NOTE — Assessment & Plan Note (Signed)
 Wt Readings from Last 3 Encounters:  02/26/24 207 lb (93.9 kg)  12/11/23 215 lb (97.5 kg)  10/22/23 213 lb (96.6 kg)  Eating better and losing weight Diet and nutrition discussed

## 2024-02-26 NOTE — Assessment & Plan Note (Signed)
 Clinically stable.  Follows up with ophthalmologist on a regular basis No concerns

## 2024-02-26 NOTE — Progress Notes (Signed)
 Brian Hopkins 89 y.o.   Chief Complaint  Patient presents with   Follow-up    No concerns    HISTORY OF PRESENT ILLNESS: This is a 89 y.o. male A1A here for 32-month follow-up of chronic medical conditions Accompanied by daughter.  Overall doing well. Has no complaints or medical concerns today.  HPI   Prior to Admission medications   Medication Sig Start Date End Date Taking? Authorizing Provider  acetaminophen  (TYLENOL ) 650 MG CR tablet Take 1,300 mg by mouth every 8 (eight) hours as needed for pain.   Yes [provider]  albuterol  (PROVENTIL ) (2.5 MG/3ML) 0.083% nebulizer solution Take 3 mLs (2.5 mg total) by nebulization every 4 (four) hours as needed for wheezing or shortness of breath. 02/06/24 02/05/25 Yes Kasa, Kurian, MD  albuterol  (VENTOLIN  HFA) 108 (90 Base) MCG/ACT inhaler INHALE 2 PUFFS INTO THE LUNGS EVERY 4 (FOUR) HOURS AS NEEDED FOR SHORTNESS OF BREATH 01/13/24  Yes Kasa, Kurian, MD  aluminum hydroxide-magnesium carbonate (GAVISCON) 95-358 MG/15ML SUSP Take by mouth as needed for heartburn or indigestion.   Yes [provider]  ANORO ELLIPTA  62.5-25 MCG/ACT AEPB Inhale 1 puff into the lungs daily. 09/08/23  Yes Kasa, Kurian, MD  azelastine  (ASTELIN ) 0.1 % nasal spray Place 2 sprays into both nostrils 2 (two) times daily. 03/29/21  Yes Kasa, Kurian, MD  azithromycin  (ZITHROMAX  Z-PAK) 250 MG tablet Take 2 tablets on Day 1 and then 1 tablet daily till gone. 12/11/23  Yes Kasa, Kurian, MD  azithromycin  (ZITHROMAX ) 250 MG tablet Take 2 tablets (500 mg) on  Day 1,  followed by 1 tablet (250 mg) once daily on Days 2 through 5. 11/17/23  Yes Tamea Dedra CROME, MD  clonazePAM  (KLONOPIN ) 0.5 MG tablet Take 1 tablet (0.5 mg total) by mouth daily as needed for anxiety. 12/31/22  Yes Clytee Heinrich, Emil Schanz, MD  fluticasone  (FLONASE ) 50 MCG/ACT nasal spray PLACE 1 SPRAY INTO BOTH NOSTRILS DAILY. PLEASE SCHEDULE OFFICE VISIT BEFORE ANY FUTURE REFILLS. 01/22/24  Yes Kasa,  Kurian, MD  hydrOXYzine  (ATARAX ) 25 MG tablet TAKE 1 TABLET BY MOUTH EVERY 8 HOURS AS NEEDED FOR ANXIETY 12/01/22  Yes Laurent Cargile, Emil Schanz, MD  Ibuprofen-Acetaminophen  (ADVIL DUAL ACTION) 125-250 MG TABS Take 2 tablets by mouth every 8 (eight) hours as needed.   Yes [provider]  ipratropium (ATROVENT ) 0.06 % nasal spray Place 2 sprays into both nostrils 4 (four) times daily. 01/29/23  Yes Cobb, Comer GAILS, NP  ipratropium-albuterol  (DUONEB) 0.5-2.5 (3) MG/3ML SOLN Inhale 3 mLs into the lungs every 6 (six) hours as needed. 09/17/23  Yes Kasa, Kurian, MD  magnesium hydroxide (MILK OF MAGNESIA) 400 MG/5ML suspension Take by mouth daily as needed for mild constipation.   Yes [provider]  Multiple Vitamin (MULTIVITAMIN) capsule Take 1 capsule by mouth daily.   Yes [provider]  Polyvinyl Alcohol-Povidone (REFRESH OP) Apply to eye as needed.   Yes [provider]  predniSONE  (DELTASONE ) 20 MG tablet Take 1 tablet (20 mg total) by mouth daily with breakfast. 10 days 12/11/23  Yes Kasa, Kurian, MD  predniSONE  (DELTASONE ) 20 MG tablet Take 1 tablet (20 mg total) by mouth daily with breakfast. 10 days 01/22/24  Yes Isaiah Scrivener, MD  Roflumilast  (DALIRESP ) 250 MCG TABS Take 250 mcg by mouth once for 1 dose. 12/11/23 02/26/24 Yes Kasa, Kurian, MD  predniSONE  (DELTASONE ) 20 MG tablet TAKE 1 TABLET (20 MG TOTAL) BY MOUTH DAILY WITH BREAKFAST. 10 DAYS Patient not taking: Reported on  02/26/2024 07/20/23   Kasa, Kurian, MD  predniSONE  (DELTASONE ) 20 MG tablet Take 1 tablet (20 mg total) by mouth daily with breakfast. 7 days Patient not taking: Reported on 02/26/2024 10/08/23   Isaiah Scrivener, MD    Allergies  Allergen Reactions   Statins Other (See Comments)    myalgias   Augmentin  [Amoxicillin -Pot Clavulanate] Rash   Sulfa Antibiotics Rash    Patient Active Problem List   Diagnosis Date Noted   Prostate cancer (HCC) 02/13/2023   Vasomotor rhinitis 01/30/2023    Chronic obstructive pulmonary disease (HCC) 01/14/2022   Auditory complaints of both ears 01/14/2022   History of elevated PSA 07/11/2021   Prediabetes 07/11/2021   History of COPD 07/11/2021   Dyslipidemia 12/16/2006    Past Medical History:  Diagnosis Date   Age-related macular degeneration, dry, right eye    Age-related macular degeneration, wet, left eye (HCC)    10-09-2022  per pt eye injection's every 8 weeks   Allergy    COPD (chronic obstructive pulmonary disease) (HCC)    pulmnologist--- dr lois isaiah;  mild copd w/ emphysema;   does not have oxygen   Decreased vision of left eye    DOE (dyspnea on exertion)    Dyslipidemia    GERD (gastroesophageal reflux disease)    Hiatal hernia    History of ulcerative colitis 2010   (10-09-2022  per pt has not had issues in several years)  previously followed by [facility] last seen 2017 (office note in care everywhere) -- left sided UC  in clinical remission ,  dx 2010 ,   History of vertebral compression fracture 2022   T8   HOH (hard of hearing)    left > right   Malignant neoplasm prostate (HCC) 07/2022   urologist--- dr matilda;  bx 04/ 2024, Gleason 4+3   Nocturia    OSA (obstructive sleep apnea) 2017   pulmonologist-- dr scrivener isaiah;   stopped using cpap   PVC's (premature ventricular contractions)    Seasonal allergies    Sleep apnea     Past Surgical History:  Procedure Laterality Date   APPENDECTOMY     open   CATARACT EXTRACTION W/ INTRAOCULAR LENS IMPLANT Right 2019   CHOLECYSTECTOMY OPEN  1985   COLONOSCOPY  2015   KNEE SURGERY Left 1952   per pt tumor removed, benign   ORCHIECTOMY Bilateral 10/17/2022   Procedure: SIMPLE ORCHIECTOMY;  Surgeon: Matilda Senior, MD;  Location: [facility];  Service: Urology;  Laterality: Bilateral;  30 MINS   TONSILLECTOMY     child    Social History   Socioeconomic History   Marital status: Widowed    Spouse name: Not on file   Number of children: Not  on file   Years of education: Not on file   Highest education level: Some college, no degree  Occupational History   Not on file  Tobacco Use   Smoking status: Former    Current packs/day: 0.00    Types: Cigarettes    Start date: 54    Quit date: 47    Years since quitting: 71.8    Passive exposure: Past (extensive second hand smoke by parents for 34yrs, and 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 12/11/23  Vaping Use   Vaping status: Never Used  Substance and Sexual Activity   Alcohol use: Not Currently   Drug use: Never   Sexual activity: Not Currently  Other Topics Concern   Not on file  Social History Narrative   Widowed   Social Drivers of Health   Financial Resource Strain: Low Risk  (02/22/2024)   Overall Financial Resource Strain (CARDIA)    Difficulty of Paying Living Expenses: Not hard at all  Food Insecurity: No Food Insecurity (02/22/2024)   Hunger Vital Sign    Worried About Running Out of Food in the Last Year: Never true    Ran Out of Food in the Last Year: Never true  Transportation Needs: No Transportation Needs (02/22/2024)   PRAPARE - Administrator, Civil Service (Medical): No    Lack of Transportation (Non-Medical): No  Physical Activity: Unknown (02/22/2024)   Exercise Vital Sign    Days of Exercise per Week: Patient declined    Minutes of Exercise per Session: Not on file  Stress: No Stress Concern Present (02/22/2024)   [employer] of Occupational Health - Occupational Stress Questionnaire    Feeling of Stress: Only a little  Social Connections: Socially Isolated (02/22/2024)   Social Connection and Isolation Panel    Frequency of Communication with Friends and Family: More than three times a week    Frequency of Social Gatherings with  Friends and Family: Once a week    Attends Religious Services: Never    Database Administrator or Organizations: No    Attends Engineer, Structural: Not on file    Marital Status: Widowed  Intimate Partner Violence: Not At Risk (10/22/2023)   Humiliation, Afraid, Rape, and Kick questionnaire    Fear of Current or Ex-Partner: No    Emotionally Abused: No    Physically Abused: No    Sexually Abused: No    No family history on file.   Review of Systems  Constitutional: Negative.  Negative for chills and fever.  HENT: Negative.  Negative for congestion and sore throat.   Respiratory: Negative.  Negative for cough and shortness of breath.   Cardiovascular: Negative.  Negative for chest pain and palpitations.  Gastrointestinal:  Negative for abdominal pain, diarrhea, nausea and vomiting.  Genitourinary: Negative.  Negative for dysuria and hematuria.  Skin: Negative.  Negative for rash.  Neurological: Negative.  Negative for dizziness and headaches.  All other systems reviewed and are negative.   Vitals:   02/26/24 1314  BP: 118/80  Pulse: 76  Temp: 97.9 F (36.6 C)  SpO2: 96%    Physical Exam Vitals reviewed.  Constitutional:      Appearance: Normal appearance.  HENT:     Head: Normocephalic.     Right Ear: Tympanic membrane, ear canal and external ear normal.     Left Ear: Tympanic membrane, ear canal and external ear normal.     Mouth/Throat:     Mouth: Mucous membranes are moist.     Pharynx: Oropharynx is clear.  Eyes:     Extraocular Movements: Extraocular movements intact.     Pupils: Pupils are equal, round, and reactive to light.  Cardiovascular:     Rate and Rhythm: Normal rate and regular rhythm.     Pulses: Normal pulses.     Heart sounds: Normal heart sounds.  Pulmonary:     Effort: Pulmonary effort is normal.     Breath sounds: Normal breath sounds.  Abdominal:  Palpations: Abdomen is soft.     Tenderness: There is no abdominal tenderness.   Musculoskeletal:     Cervical back: No tenderness.  Lymphadenopathy:     Cervical: No cervical adenopathy.  Skin:    General: Skin is warm and dry.  Neurological:     General: No focal deficit present.     Mental Status: He is alert and oriented to person, place, and time.  Psychiatric:        Mood and Affect: Mood normal.        Behavior: Behavior normal.      ASSESSMENT & PLAN: A total of 42 minutes was spent with the patient and counseling/coordination of care regarding preparing for this visit, review of most recent office visit notes, review of multiple chronic medical conditions and their management, review of all medications, review of most recent bloodwork results, review of health maintenance items, education on nutrition, prognosis, documentation, and need for follow up.  Problem List Items Addressed This Visit       Respiratory   Chronic obstructive pulmonary disease (HCC) - Primary   Clinically stable.  No concerns Evaluation by pulmonary last August as follows: Assessment of COPD Assessment of bronchiectasis Only use albuterol  as needed For mucus clearance and pulmonary toilet-plan for following regimen DuoNeb nebulizer in AM-use Incentive spirometry 5 times, then use flutter valve 5 times DuoNeb nebulizer in the afternoon-use incentive spirometry 5 times, use flutter valve 5 times DuoNeb nebulizer at [employer] spirometry 5 times use flutter valve 5 times      Relevant Orders   Comprehensive metabolic panel with GFR   CBC with Differential/Platelet   Hemoglobin A1c   Lipid panel     Other   Dyslipidemia   Chronic stable condition Intolerant to statins. Diet and nutrition discussed      Relevant Orders   Comprehensive metabolic panel with GFR   CBC with Differential/Platelet   Hemoglobin A1c   Lipid panel   Prediabetes   Clinically stable. Diet and nutrition discussed. Blood work done today including hemoglobin A1c      Relevant  Orders   Comprehensive metabolic panel with GFR   CBC with Differential/Platelet   Hemoglobin A1c   Lipid panel   Exudative age-related macular degeneration, unspecified laterality, unspecified stage (HCC)   Clinically stable.  Follows up with ophthalmologist on a regular basis No concerns      Obesity, morbid (HCC)   Wt Readings from Last 3 Encounters:  02/26/24 207 lb (93.9 kg)  12/11/23 215 lb (97.5 kg)  10/22/23 213 lb (96.6 kg)  Eating better and losing weight Diet and nutrition discussed       Patient Instructions  Health Maintenance After Age 80 After age 25, you are at a higher risk for certain long-term diseases and infections as well as injuries from falls. Falls are a major cause of broken bones and head injuries in people who are older than age 12. Getting regular preventive care can help to keep you healthy and well. Preventive care includes getting regular testing and making lifestyle changes as recommended by your health care provider. Talk with your health care provider about: Which screenings and tests you should have. A screening is a test that checks for a disease when you have no symptoms. A diet and exercise plan that is right for you. What should I know about screenings and tests to prevent falls? Screening and testing are the best ways to find a health problem early. Early  diagnosis and treatment give you the best chance of managing medical conditions that are common after age 16. Certain conditions and lifestyle choices may make you more likely to have a fall. Your health care provider may recommend: Regular vision checks. Poor vision and conditions such as cataracts can make you more likely to have a fall. If you wear glasses, make sure to get your prescription updated if your vision changes. Medicine review. Work with your health care provider to regularly review all of the medicines you are taking, including over-the-counter medicines. Ask your health care  provider about any side effects that may make you more likely to have a fall. Tell your health care provider if any medicines that you take make you feel dizzy or sleepy. Strength and balance checks. Your health care provider may recommend certain tests to check your strength and balance while standing, walking, or changing positions. Foot health exam. Foot pain and numbness, as well as not wearing proper footwear, can make you more likely to have a fall. Screenings, including: Osteoporosis screening. Osteoporosis is a condition that causes the bones to get weaker and break more easily. Blood pressure screening. Blood pressure changes and medicines to control blood pressure can make you feel dizzy. Depression screening. You may be more likely to have a fall if you have a fear of falling, feel depressed, or feel unable to do activities that you used to do. Alcohol use screening. Using too much alcohol can affect your balance and may make you more likely to have a fall. Follow these instructions at home: Lifestyle Do not drink alcohol if: Your health care provider tells you not to drink. If you drink alcohol: Limit how much you have to: 0-1 drink a day for women. 0-2 drinks a day for men. Know how much alcohol is in your drink. In the [place] one drink equals one 12 oz bottle of beer (355 mL), one 5 oz glass of wine (148 mL), or one 1 oz glass of hard liquor (44 mL). Do not use any products that contain nicotine or tobacco. These products include cigarettes, chewing tobacco, and vaping devices, such as e-cigarettes. If you need help quitting, ask your health care provider. Activity  Follow a regular exercise program to stay fit. This will help you maintain your balance. Ask your health care provider what types of exercise are appropriate for you. If you need a cane or walker, use it as recommended by your health care provider. Wear supportive shoes that have nonskid soles. Safety  Remove any  tripping hazards, such as rugs, cords, and clutter. Install safety equipment such as grab bars in bathrooms and safety rails on stairs. Keep rooms and walkways well-lit. General instructions Talk with your health care provider about your risks for falling. Tell your health care provider if: You fall. Be sure to tell your health care provider about all falls, even ones that seem minor. You feel dizzy, tiredness (fatigue), or off-balance. Take over-the-counter and prescription medicines only as told by your health care provider. These include supplements. Eat a healthy diet and maintain a healthy weight. A healthy diet includes low-fat dairy products, low-fat (lean) meats, and fiber from whole grains, beans, and lots of fruits and vegetables. Stay current with your vaccines. Schedule regular health, dental, and eye exams. Summary Having a healthy lifestyle and getting preventive care can help to protect your health and wellness after age 54. Screening and testing are the best way to find a health problem early  and help you avoid having a fall. Early diagnosis and treatment give you the best chance for managing medical conditions that are more common for people who are older than age 66. Falls are a major cause of broken bones and head injuries in people who are older than age 61. Take precautions to prevent a fall at home. Work with your health care provider to learn what changes you can make to improve your health and wellness and to prevent falls. This information is not intended to replace advice given to you by your health care provider. Make sure you discuss any questions you have with your health care provider. Document Revised: 08/28/2020 Document Reviewed: 08/28/2020 Elsevier Patient Education  2024 Elsevier Inc.     Emil Schaumann, MD [facility]

## 2024-02-26 NOTE — Assessment & Plan Note (Signed)
 Clinically stable.  No concerns Evaluation by pulmonary last August as follows: Assessment of COPD Assessment of bronchiectasis Only use albuterol  as needed For mucus clearance and pulmonary toilet-plan for following regimen DuoNeb nebulizer in AM-use Incentive spirometry 5 times, then use flutter valve 5 times DuoNeb nebulizer in the afternoon-use incentive spirometry 5 times, use flutter valve 5 times DuoNeb nebulizer at [employer] spirometry 5 times use flutter valve 5 times

## 2024-02-26 NOTE — Patient Instructions (Signed)
 Health Maintenance After Age 88 After age 27, you are at a higher risk for certain long-term diseases and infections as well as injuries from falls. Falls are a major cause of broken bones and head injuries in people who are older than age 73. Getting regular preventive care can help to keep you healthy and well. Preventive care includes getting regular testing and making lifestyle changes as recommended by your health care provider. Talk with your health care provider about: Which screenings and tests you should have. A screening is a test that checks for a disease when you have no symptoms. A diet and exercise plan that is right for you. What should I know about screenings and tests to prevent falls? Screening and testing are the best ways to find a health problem early. Early diagnosis and treatment give you the best chance of managing medical conditions that are common after age 90. Certain conditions and lifestyle choices may make you more likely to have a fall. Your health care provider may recommend: Regular vision checks. Poor vision and conditions such as cataracts can make you more likely to have a fall. If you wear glasses, make sure to get your prescription updated if your vision changes. Medicine review. Work with your health care provider to regularly review all of the medicines you are taking, including over-the-counter medicines. Ask your health care provider about any side effects that may make you more likely to have a fall. Tell your health care provider if any medicines that you take make you feel dizzy or sleepy. Strength and balance checks. Your health care provider may recommend certain tests to check your strength and balance while standing, walking, or changing positions. Foot health exam. Foot pain and numbness, as well as not wearing proper footwear, can make you more likely to have a fall. Screenings, including: Osteoporosis screening. Osteoporosis is a condition that causes  the bones to get weaker and break more easily. Blood pressure screening. Blood pressure changes and medicines to control blood pressure can make you feel dizzy. Depression screening. You may be more likely to have a fall if you have a fear of falling, feel depressed, or feel unable to do activities that you used to do. Alcohol  use screening. Using too much alcohol  can affect your balance and may make you more likely to have a fall. Follow these instructions at home: Lifestyle Do not drink alcohol  if: Your health care provider tells you not to drink. If you drink alcohol : Limit how much you have to: 0-1 drink a day for women. 0-2 drinks a day for men. Know how much alcohol  is in your drink. In the [place] one drink equals one 12 oz bottle of beer (355 mL), one 5 oz glass of wine (148 mL), or one 1 oz glass of hard liquor (44 mL). Do not use any products that contain nicotine or tobacco. These products include cigarettes, chewing tobacco, and vaping devices, such as e-cigarettes. If you need help quitting, ask your health care provider. Activity  Follow a regular exercise program to stay fit. This will help you maintain your balance. Ask your health care provider what types of exercise are appropriate for you. If you need a cane or walker, use it as recommended by your health care provider. Wear supportive shoes that have nonskid soles. Safety  Remove any tripping hazards, such as rugs, cords, and clutter. Install safety equipment such as grab bars in bathrooms and safety rails on stairs. Keep rooms and walkways  well-lit. General instructions Talk with your health care provider about your risks for falling. Tell your health care provider if: You fall. Be sure to tell your health care provider about all falls, even ones that seem minor. You feel dizzy, tiredness (fatigue), or off-balance. Take over-the-counter and prescription medicines only as told by your health care provider. These include  supplements. Eat a healthy diet and maintain a healthy weight. A healthy diet includes low-fat dairy products, low-fat (lean) meats, and fiber from whole grains, beans, and lots of fruits and vegetables. Stay current with your vaccines. Schedule regular health, dental, and eye exams. Summary Having a healthy lifestyle and getting preventive care can help to protect your health and wellness after age 15. Screening and testing are the best way to find a health problem early and help you avoid having a fall. Early diagnosis and treatment give you the best chance for managing medical conditions that are more common for people who are older than age 42. Falls are a major cause of broken bones and head injuries in people who are older than age 64. Take precautions to prevent a fall at home. Work with your health care provider to learn what changes you can make to improve your health and wellness and to prevent falls. This information is not intended to replace advice given to you by your health care provider. Make sure you discuss any questions you have with your health care provider. Document Revised: 08/28/2020 Document Reviewed: 08/28/2020 Elsevier Patient Education  2024 [employer].

## 2024-02-26 NOTE — Assessment & Plan Note (Signed)
 Clinically stable. Diet and nutrition discussed. Blood work done today including hemoglobin A1c

## 2024-02-28 ENCOUNTER — Encounter: Payer: Self-pay | Admitting: Internal Medicine

## 2024-03-01 ENCOUNTER — Other Ambulatory Visit: Payer: Self-pay | Admitting: Internal Medicine

## 2024-03-01 DIAGNOSIS — J449 Chronic obstructive pulmonary disease, unspecified: Secondary | ICD-10-CM

## 2024-03-01 MED ORDER — BUDESONIDE 0.5 MG/2ML IN SUSP: 0.5000 mg | Freq: Two times a day (BID) | RESPIRATORY_TRACT | 0 refills | Status: AC

## 2024-03-01 NOTE — Progress Notes (Signed)
 Pulmicort nebs to regimen

## 2024-03-02 ENCOUNTER — Encounter: Payer: Self-pay | Admitting: Emergency Medicine

## 2024-03-02 ENCOUNTER — Other Ambulatory Visit: Payer: Self-pay | Admitting: Emergency Medicine

## 2024-03-02 MED ORDER — TRAMADOL HCL 50 MG PO TABS: 50.0000 mg | ORAL_TABLET | Freq: Three times a day (TID) | ORAL | 1 refills | Status: AC | PRN

## 2024-03-02 MED ORDER — TRAMADOL HCL 50 MG PO TABS: 50.0000 mg | ORAL_TABLET | Freq: Three times a day (TID) | ORAL | 0 refills | Status: AC | PRN

## 2024-03-02 NOTE — Addendum Note (Signed)
 Addended by: ROSALVA LEX RAMAN on: 03/02/2024 10:54 AM   Modules accepted: Orders

## 2024-03-02 NOTE — Telephone Encounter (Signed)
 Please advise on refill.

## 2024-03-02 NOTE — Telephone Encounter (Signed)
 Okay to refill?

## 2024-03-02 NOTE — Telephone Encounter (Signed)
 New prescription sent to pharmacy of record today.  Thanks.

## 2024-03-02 NOTE — Telephone Encounter (Signed)
 My apologies I read this wrong can you send in a new prescription for tramadol  to pharmacy on file for patient. This medication was no longer on file

## 2024-03-07 ENCOUNTER — Encounter: Payer: Self-pay | Admitting: Emergency Medicine

## 2024-03-08 ENCOUNTER — Other Ambulatory Visit: Payer: Self-pay | Admitting: Emergency Medicine

## 2024-03-08 ENCOUNTER — Telehealth: Payer: Self-pay

## 2024-03-08 MED ORDER — CYCLOBENZAPRINE HCL 5 MG PO TABS: 5.0000 mg | ORAL_TABLET | Freq: Three times a day (TID) | ORAL | 1 refills | Status: AC | PRN

## 2024-03-08 NOTE — Telephone Encounter (Signed)
 I spoke with the patient's daughter, he is now scheduled to see Dr. Isaiah on 05/25/2023, which will be his 6 month follow. She said he is doing good and they would like to keep that appt. She will call back if he has any problems before then.  Nothing further needed.

## 2024-03-08 NOTE — Telephone Encounter (Signed)
 Copied from CRM [id number]. Topic: Appointments - Appointment Cancel/Reschedule >> Mar 08, 2024  9:06 AM Leila C wrote: Patient/patient representative is calling to cancel or reschedule an appointment. Refer to attachments for appointment information.  Patient's child Dorthea [phone] states patient pulled his back muscle and needs to stay immobile. Rescheduled appointment for 03/11/24 at 2:45 pm with Dr. Isaiah to 05/18/24 at 1 pm. >> Mar 08, 2024  9:07 AM Leila BROCKS wrote: Dorthea [phone] states patient is doing better with the medication from Dr. Isaiah. Dorthea asked if patient needs to be seen sooner, patient will be available until December for an appointment. Please advise and call back.

## 2024-03-08 NOTE — Telephone Encounter (Signed)
 Recommend muscle relaxer 3 times a day as needed, 5 mg of Flexeril.  New prescription sent to pharmacy of record today.

## 2024-03-11 ENCOUNTER — Emergency Department (HOSPITAL_COMMUNITY)
Admission: EM | Admit: 2024-03-11 | Discharge: 2024-03-11 | Disposition: A | Discharge status: Home / Self Care | Attending: Emergency Medicine | Admitting: Emergency Medicine

## 2024-03-11 ENCOUNTER — Encounter: Payer: Self-pay | Admitting: Emergency Medicine

## 2024-03-11 ENCOUNTER — Emergency Department (HOSPITAL_COMMUNITY)

## 2024-03-11 ENCOUNTER — Ambulatory Visit: Admitting: Internal Medicine

## 2024-03-11 ENCOUNTER — Encounter (HOSPITAL_COMMUNITY): Payer: Self-pay

## 2024-03-11 DIAGNOSIS — X500XXA Overexertion from strenuous movement or load, initial encounter: Secondary | ICD-10-CM | POA: Diagnosis not present

## 2024-03-11 DIAGNOSIS — S32018A Other fracture of first lumbar vertebra, initial encounter for closed fracture: Secondary | ICD-10-CM | POA: Diagnosis not present

## 2024-03-11 DIAGNOSIS — S32010A Wedge compression fracture of first lumbar vertebra, initial encounter for closed fracture: Secondary | ICD-10-CM

## 2024-03-11 DIAGNOSIS — R109 Unspecified abdominal pain: Secondary | ICD-10-CM | POA: Diagnosis not present

## 2024-03-11 DIAGNOSIS — S3992XA Unspecified injury of lower back, initial encounter: Secondary | ICD-10-CM | POA: Diagnosis present

## 2024-03-11 MED ORDER — OXYCODONE HCL 5 MG PO TABS
2.5000 mg | ORAL_TABLET | Freq: Once | ORAL | Status: AC
Start: 2024-03-11 — End: 2024-03-11
  Administered 2024-03-11: 2.5 mg via ORAL
  Filled 2024-03-11: qty 1

## 2024-03-11 MED ORDER — METHOCARBAMOL 500 MG PO TABS: 500.0000 mg | ORAL_TABLET | Freq: Two times a day (BID) | ORAL | 0 refills | Status: AC

## 2024-03-11 MED ORDER — KETOROLAC TROMETHAMINE 15 MG/ML IJ SOLN
15.0000 mg | Freq: Once | INTRAMUSCULAR | Status: AC
  Administered 2024-03-11: 15 mg via INTRAMUSCULAR
  Filled 2024-03-11: qty 1

## 2024-03-11 MED ORDER — ACETAMINOPHEN 500 MG PO TABS
1000.0000 mg | ORAL_TABLET | Freq: Once | ORAL | Status: AC
  Administered 2024-03-11: 1000 mg via ORAL
  Filled 2024-03-11: qty 2

## 2024-03-11 MED ORDER — METHYLPREDNISOLONE 4 MG PO TBPK: ORAL_TABLET | ORAL | 0 refills | Status: AC

## 2024-03-11 MED ORDER — DIAZEPAM 2 MG PO TABS
2.0000 mg | ORAL_TABLET | Freq: Once | ORAL | Status: AC
  Administered 2024-03-11: 2 mg via ORAL
  Filled 2024-03-11: qty 1

## 2024-03-11 MED ORDER — DICLOFENAC SODIUM 1 % EX GEL: 4.0000 g | Freq: Four times a day (QID) | CUTANEOUS | 0 refills | Status: AC

## 2024-03-11 NOTE — Telephone Encounter (Signed)
Schedule patient to be seen

## 2024-03-11 NOTE — Discharge Instructions (Signed)
 You have a compression fracture in your back.  This is at L1.  I have given you information to call the neurosurgery office.  They can evaluate you to see if you would benefit from a procedure.   There is been a lot of research on back pain, unfortunately the only thing that seems to really help is Tylenol  and ibuprofen.  Relative rest is also important to not lift greater than 10 pounds bending or twisting at the waist.  Please follow-up with your family physician.  The other thing that really seems to benefit patients is physical therapy which your doctor may send you for.  Please return to the emergency department for new numbness or weakness to your arms or legs. Difficulty with urinating or urinating or pooping on yourself.  Also if you cannot feel toilet paper when you wipe or get a fever.   Stop taking the Advil. Take the steroids as prescribed.  Use the gel as prescribed. Also take tylenol  1000mg (2 extra strength) four times a day.

## 2024-03-11 NOTE — Progress Notes (Signed)
 Orthopedic Tech Progress Note Patient Details:  Zeki Bedrosian Loree Jul 27, 1931 [id number]  Patient ID: Elsie LABOR Kerkman, male   DOB: [birth year], 89 y.o.   MRN: [id number]  Amadeo, Coke 03/11/2024, 1:17 PM LSO ordered from [facility] clinic

## 2024-03-11 NOTE — Telephone Encounter (Signed)
Recommend emergency department evaluation

## 2024-03-11 NOTE — ED Triage Notes (Signed)
 Pt presents with c/o lower back pain secondary to lifting something heavy approx 10 days ago. Pt reports he has been seen by his MD and given several different medications for the pain. Pt reports that when he woke up this morning, he had some bowel incontinence so he decided to come to the ER.

## 2024-03-11 NOTE — ED Provider Notes (Signed)
 [name] Provider Note   CSN: [id number] Arrival date & time: 03/11/24  1005     Patient presents with: Back Pain   Brian Hopkins is a 89 y.o. male.   89 yo M with a  cc of low back pain.  Going on for about a week and a half.  Patient had lifted a dresser and after that had had some discomfort.  Worse in the morning worse when trying to get up out of bed.  Denies loss of bowel or bladder denies loss of rectal sensation denies trauma.  Denies numbness or weakness to his legs.  He denies history of back pain.  He had messaged his family doctor who started him on tramadol .  Has been taking this as well as aspirin alternating hot and cold.  Not having significant improvement.  Came here for evaluation.   Back Pain      Prior to Admission medications   Medication Sig Start Date End Date Taking? Authorizing Provider  acetaminophen  (TYLENOL ) 650 MG CR tablet Take 1,300 mg by mouth every 8 (eight) hours as needed for pain.    [provider]  albuterol  (PROVENTIL ) (2.5 MG/3ML) 0.083% nebulizer solution Take 3 mLs (2.5 mg total) by nebulization every 4 (four) hours as needed for wheezing or shortness of breath. 02/06/24 02/05/25  Kasa, Kurian, MD  albuterol  (VENTOLIN  HFA) 108 (90 Base) MCG/ACT inhaler INHALE 2 PUFFS INTO THE LUNGS EVERY 4 (FOUR) HOURS AS NEEDED FOR SHORTNESS OF BREATH 01/13/24   Kasa, Kurian, MD  aluminum hydroxide-magnesium carbonate (GAVISCON) 95-358 MG/15ML SUSP Take by mouth as needed for heartburn or indigestion.    [provider]  ANORO ELLIPTA  62.5-25 MCG/ACT AEPB Inhale 1 puff into the lungs daily. 09/08/23   Kasa, Kurian, MD  azelastine  (ASTELIN ) 0.1 % nasal spray Place 2 sprays into both nostrils 2 (two) times daily. 03/29/21   Kasa, Kurian, MD  azithromycin  (ZITHROMAX  Z-PAK) 250 MG tablet Take 2 tablets on Day 1 and then 1 tablet daily till gone. 12/11/23   Kasa, Kurian, MD  azithromycin  (ZITHROMAX ) 250  MG tablet Take 2 tablets (500 mg) on  Day 1,  followed by 1 tablet (250 mg) once daily on Days 2 through 5. 11/17/23   Brian Dedra CROME, MD  budesonide (PULMICORT) 0.5 MG/2ML nebulizer solution Take 2 mLs (0.5 mg total) by nebulization 2 (two) times daily. 03/01/24 03/01/25  Kasa, Kurian, MD  clonazePAM  (KLONOPIN ) 0.5 MG tablet Take 1 tablet (0.5 mg total) by mouth daily as needed for anxiety. 12/31/22   Brian Emil Schanz, MD  cyclobenzaprine (FLEXERIL) 5 MG tablet Take 1 tablet (5 mg total) by mouth 3 (three) times daily as needed for muscle spasms. 03/08/24   Brian Emil Schanz, MD  fluticasone  (FLONASE ) 50 MCG/ACT nasal spray PLACE 1 SPRAY INTO BOTH NOSTRILS DAILY. PLEASE SCHEDULE OFFICE VISIT BEFORE ANY FUTURE REFILLS. 01/22/24   Kasa, Kurian, MD  hydrOXYzine  (ATARAX ) 25 MG tablet TAKE 1 TABLET BY MOUTH EVERY 8 HOURS AS NEEDED FOR ANXIETY 12/01/22   Brian Emil Schanz, MD  Ibuprofen-Acetaminophen  (ADVIL DUAL ACTION) 125-250 MG TABS Take 2 tablets by mouth every 8 (eight) hours as needed.    [provider]  ipratropium (ATROVENT ) 0.06 % nasal spray Place 2 sprays into both nostrils 4 (four) times daily. 01/29/23   Cobb, Comer GAILS, NP  ipratropium-albuterol  (DUONEB) 0.5-2.5 (3) MG/3ML SOLN Inhale 3 mLs into the lungs every 6 (six) hours as needed. 09/17/23  Kasa, Kurian, MD  magnesium hydroxide (MILK OF MAGNESIA) 400 MG/5ML suspension Take by mouth daily as needed for mild constipation.    [provider]  Multiple Vitamin (MULTIVITAMIN) capsule Take 1 capsule by mouth daily.    [provider]  Polyvinyl Alcohol-Povidone (REFRESH OP) Apply to eye as needed.    [provider]  predniSONE  (DELTASONE ) 20 MG tablet TAKE 1 TABLET (20 MG TOTAL) BY MOUTH DAILY WITH BREAKFAST. 10 DAYS Patient not taking: Reported on 02/26/2024 07/20/23   Kasa, Kurian, MD  predniSONE  (DELTASONE ) 20 MG tablet Take 1 tablet (20 mg total) by mouth daily with breakfast. 7  days Patient not taking: Reported on 02/26/2024 10/08/23   Kasa, Kurian, MD  predniSONE  (DELTASONE ) 20 MG tablet Take 1 tablet (20 mg total) by mouth daily with breakfast. 10 days 12/11/23   Brian Scrivener, MD  predniSONE  (DELTASONE ) 20 MG tablet Take 1 tablet (20 mg total) by mouth daily with breakfast. 10 days 01/22/24   Brian Scrivener, MD  Roflumilast  (DALIRESP ) 250 MCG TABS Take 250 mcg by mouth once for 1 dose. 12/11/23 02/26/24  Kasa, Kurian, MD    Allergies: Statins, Augmentin  [amoxicillin -pot clavulanate], and Sulfa antibiotics    Review of Systems  Musculoskeletal:  Positive for back pain.    Updated Vital Signs BP (!) 142/86 (BP Location: Left Arm)   Pulse 98   Temp 97.8 F (36.6 C) (Oral)   Resp (!) 22   SpO2 98%   Physical Exam Vitals and nursing note reviewed.  Constitutional:      Appearance: He is well-developed.  HENT:     Head: Normocephalic and atraumatic.  Eyes:     Pupils: Pupils are equal, round, and reactive to light.  Neck:     Vascular: No JVD.  Cardiovascular:     Rate and Rhythm: Normal rate and regular rhythm.     Heart sounds: No murmur heard.    No friction rub. No gallop.  Pulmonary:     Effort: No respiratory distress.     Breath sounds: No wheezing.  Abdominal:     General: There is no distension.     Tenderness: There is no abdominal tenderness. There is no guarding or rebound.  Musculoskeletal:        General: Normal range of motion.     Cervical back: Normal range of motion and neck supple.     Comments: Diffuse low back pain.  Does have some midline pain, no obvious step-offs or deformities.  Pulse motor and sensation are intact distally.  Negative straight leg raise test.  No appreciable abdominal discomfort.  Skin:    Coloration: Skin is not pale.     Findings: No rash.  Neurological:     Mental Status: He is alert and oriented to person, place, and time.  Psychiatric:        Behavior: Behavior normal.     (all labs ordered are  listed, but only abnormal results are displayed) Labs Reviewed - No data to display  EKG: None  Radiology: No results found.   Procedures   Medications Ordered in the ED  acetaminophen  (TYLENOL ) tablet 1,000 mg (has no administration in time range)  ketorolac  (TORADOL ) 15 MG/ML injection 15 mg (has no administration in time range)  oxyCODONE  (Oxy IR/ROXICODONE ) immediate release tablet 2.5 mg (has no administration in time range)  diazepam  (VALIUM ) tablet 2 mg (has no administration in time range)  Medical Decision Making Amount and/or Complexity of Data Reviewed Radiology: ordered.  Risk OTC drugs. Prescription drug management.   6 yoM with a chief complaints of low back pain.  Going on for about a week and a half.  Was lifting a dresser at the onset.  Will obtain CT imaging of the abdomen pelvis and L-spine.  Treat symptoms.  Reassess.  Patient feeling mildly better on repeat assessment.  CT imaging concerning for an L1 compression fracture.  Discussed this with patient and family.  He reportedly has a TLSO at home but does not think it is very comfortable.  Will order lumbar corset.  Give follow-up for neurosurgery.  Course of steroids topical NSAIDs.   1:23 PM:  I have discussed the diagnosis/risks/treatment options with the patient and family.  Evaluation and diagnostic testing in the emergency department does not suggest an emergent condition requiring admission or immediate intervention beyond what has been performed at this time.  They will follow up with Neurosurgery. We also discussed returning to the ED immediately if new or worsening sx occur. We discussed the sx which are most concerning (e.g., sudden worsening pain, fever, inability to tolerate by mouth, cauda equina s/sx) that necessitate immediate return. Medications administered to the patient during their visit and any new prescriptions provided to the patient are listed  below.  Medications given during this visit Medications  acetaminophen  (TYLENOL ) tablet 1,000 mg (1,000 mg Oral Given 03/11/24 1103)  ketorolac  (TORADOL ) 15 MG/ML injection 15 mg (15 mg Intramuscular Given 03/11/24 1110)  oxyCODONE  (Oxy IR/ROXICODONE ) immediate release tablet 2.5 mg (2.5 mg Oral Given 03/11/24 1103)  diazepam  (VALIUM ) tablet 2 mg (2 mg Oral Given 03/11/24 1133)     The patient appears reasonably screen and/or stabilized for discharge and I doubt any other medical condition or other [facility] requiring further screening, evaluation, or treatment in the ED at this time prior to discharge.       Final diagnoses:  None    ED Discharge Orders     None          Emil Share, DO 03/11/24 1416

## 2024-04-01 ENCOUNTER — Ambulatory Visit (INDEPENDENT_AMBULATORY_CARE_PROVIDER_SITE_OTHER): Admitting: Emergency Medicine

## 2024-04-01 ENCOUNTER — Encounter: Payer: Self-pay | Admitting: Emergency Medicine

## 2024-04-01 VITALS — BP 122/80 | HR 93 | Temp 97.7°F | Ht 63.0 in | Wt 195.0 lb

## 2024-04-01 DIAGNOSIS — H35033 Hypertensive retinopathy, bilateral: Secondary | ICD-10-CM | POA: Diagnosis not present

## 2024-04-01 DIAGNOSIS — S32010A Wedge compression fracture of first lumbar vertebra, initial encounter for closed fracture: Secondary | ICD-10-CM | POA: Insufficient documentation

## 2024-04-01 DIAGNOSIS — H353223 Exudative age-related macular degeneration, left eye, with inactive scar: Secondary | ICD-10-CM | POA: Diagnosis not present

## 2024-04-01 DIAGNOSIS — H43813 Vitreous degeneration, bilateral: Secondary | ICD-10-CM | POA: Diagnosis not present

## 2024-04-01 DIAGNOSIS — E785 Hyperlipidemia, unspecified: Secondary | ICD-10-CM

## 2024-04-01 DIAGNOSIS — R42 Dizziness and giddiness: Secondary | ICD-10-CM | POA: Diagnosis not present

## 2024-04-01 DIAGNOSIS — H353211 Exudative age-related macular degeneration, right eye, with active choroidal neovascularization: Secondary | ICD-10-CM | POA: Diagnosis not present

## 2024-04-01 DIAGNOSIS — R7303 Prediabetes: Secondary | ICD-10-CM

## 2024-04-01 DIAGNOSIS — J449 Chronic obstructive pulmonary disease, unspecified: Secondary | ICD-10-CM

## 2024-04-01 DIAGNOSIS — S32010D Wedge compression fracture of first lumbar vertebra, subsequent encounter for fracture with routine healing: Secondary | ICD-10-CM

## 2024-04-01 DIAGNOSIS — H43393 Other vitreous opacities, bilateral: Secondary | ICD-10-CM | POA: Diagnosis not present

## 2024-04-01 DIAGNOSIS — H353113 Nonexudative age-related macular degeneration, right eye, advanced atrophic without subfoveal involvement: Secondary | ICD-10-CM | POA: Diagnosis not present

## 2024-04-01 NOTE — Progress Notes (Signed)
 Brian Hopkins 89 y.o.   Chief Complaint  Patient presents with   Follow-up    ER follow up pt states that he has been experiencing vertigo within the last week. Pt is no longer taking the tramadol , pt is does a inclined bed and sometimes even still being at a 90 degree he can still get dizzy and also when he turns over  he still get dizzy as well, pt states that he ears feel full     HISTORY OF PRESENT ILLNESS: This is a 89 y.o. male A1A accompanied by daughter today Here for follow-up of emergency department visit on 03/11/2024 when he presented with severe back pain X-rays reveal compression fractures at lumbar level Pain management was discussed and successfully implemented. Also complaining of occasional dizziness, mostly when turning head or bending it too fast. No other associated symptoms No other complaints or medical concerns today. ER assessment as follows: Patient feeling mildly better on repeat assessment. CT imaging concerning for an L1 compression fracture. Discussed this with patient and family. He reportedly has a TLSO at home but does not think it is very comfortable. Will order lumbar corset. Give follow-up for neurosurgery. Course of steroids topical NSAIDs.  HPI   Prior to Admission medications  Medication Sig Start Date End Date Taking? Authorizing Provider  acetaminophen  (TYLENOL ) 650 MG CR tablet Take 1,300 mg by mouth every 8 (eight) hours as needed for pain.   Yes [provider]  albuterol  (PROVENTIL ) (2.5 MG/3ML) 0.083% nebulizer solution Take 3 mLs (2.5 mg total) by nebulization every 4 (four) hours as needed for wheezing or shortness of breath. 02/06/24 02/05/25 Yes Kasa, Kurian, MD  albuterol  (VENTOLIN  HFA) 108 (90 Base) MCG/ACT inhaler INHALE 2 PUFFS INTO THE LUNGS EVERY 4 (FOUR) HOURS AS NEEDED FOR SHORTNESS OF BREATH 01/13/24  Yes Kasa, Kurian, MD  aluminum hydroxide-magnesium carbonate (GAVISCON) 95-358 MG/15ML SUSP Take by mouth as needed for  heartburn or indigestion.   Yes [provider]  ANORO ELLIPTA  62.5-25 MCG/ACT AEPB Inhale 1 puff into the lungs daily. 09/08/23  Yes Kasa, Kurian, MD  azelastine  (ASTELIN ) 0.1 % nasal spray Place 2 sprays into both nostrils 2 (two) times daily. 03/29/21  Yes Kasa, Kurian, MD  azithromycin  (ZITHROMAX  Z-PAK) 250 MG tablet Take 2 tablets on Day 1 and then 1 tablet daily till gone. 12/11/23  Yes Kasa, Kurian, MD  azithromycin  (ZITHROMAX ) 250 MG tablet Take 2 tablets (500 mg) on  Day 1,  followed by 1 tablet (250 mg) once daily on Days 2 through 5. 11/17/23  Yes Tamea Dedra CROME, MD  budesonide  (PULMICORT ) 0.5 MG/2ML nebulizer solution Take 2 mLs (0.5 mg total) by nebulization 2 (two) times daily. 03/01/24 03/01/25 Yes Kasa, Kurian, MD  clonazePAM  (KLONOPIN ) 0.5 MG tablet Take 1 tablet (0.5 mg total) by mouth daily as needed for anxiety. 12/31/22  Yes Rakayla Ricklefs, Emil Schanz, MD  cyclobenzaprine  (FLEXERIL ) 5 MG tablet Take 1 tablet (5 mg total) by mouth 3 (three) times daily as needed for muscle spasms. 03/08/24  Yes Tarig Zimmers, Emil Schanz, MD  diclofenac  Sodium (VOLTAREN ) 1 % GEL Apply 4 g topically 4 (four) times daily. 03/11/24  Yes Emil Share, DO  fluticasone  (FLONASE ) 50 MCG/ACT nasal spray PLACE 1 SPRAY INTO BOTH NOSTRILS DAILY. PLEASE SCHEDULE OFFICE VISIT BEFORE ANY FUTURE REFILLS. 01/22/24  Yes Kasa, Kurian, MD  hydrOXYzine  (ATARAX ) 25 MG tablet TAKE 1 TABLET BY MOUTH EVERY 8 HOURS AS NEEDED FOR ANXIETY 12/01/22  Yes Jerzy Roepke, Emil Schanz, MD  Ibuprofen-Acetaminophen  (ADVIL DUAL ACTION) 125-250 MG TABS Take 2 tablets by mouth every 8 (eight) hours as needed.   Yes [provider]  ipratropium (ATROVENT ) 0.06 % nasal spray Place 2 sprays into both nostrils 4 (four) times daily. 01/29/23  Yes Cobb, Comer GAILS, NP  ipratropium-albuterol  (DUONEB) 0.5-2.5 (3) MG/3ML SOLN Inhale 3 mLs into the lungs every 6 (six) hours as needed. 09/17/23  Yes Kasa, Kurian, MD  magnesium hydroxide (MILK OF  MAGNESIA) 400 MG/5ML suspension Take by mouth daily as needed for mild constipation.   Yes [provider]  methocarbamol  (ROBAXIN ) 500 MG tablet Take 1 tablet (500 mg total) by mouth 2 (two) times daily. 03/11/24  Yes Emil Share, DO  methylPREDNISolone  (MEDROL  DOSEPAK) 4 MG TBPK tablet Day 1: 8mg  before breakfast, 4 mg after lunch, 4 mg after supper, and 8 mg at bedtime Day 2: 4 mg before breakfast, 4 mg after lunch, 4 mg  after supper, and 8 mg  at bedtime Day 3:  4 mg  before breakfast, 4 mg  after lunch, 4 mg after supper, and 4 mg  at bedtime Day 4: 4 mg  before breakfast, 4 mg  after lunch, and 4 mg at bedtime Day 5: 4 mg  before breakfast and 4 mg at bedtime Day 6: 4 mg  before breakfast 03/11/24  Yes Floyd, Dan, DO  Multiple Vitamin (MULTIVITAMIN) capsule Take 1 capsule by mouth daily.   Yes [provider]  Polyvinyl Alcohol-Povidone (REFRESH OP) Apply to eye as needed.   Yes [provider]  predniSONE  (DELTASONE ) 20 MG tablet Take 1 tablet (20 mg total) by mouth daily with breakfast. 10 days 12/11/23  Yes Kasa, Kurian, MD  predniSONE  (DELTASONE ) 20 MG tablet Take 1 tablet (20 mg total) by mouth daily with breakfast. 10 days 01/22/24  Yes Isaiah Scrivener, MD  Roflumilast  (DALIRESP ) 250 MCG TABS Take 250 mcg by mouth once for 1 dose. 12/11/23 04/01/24 Yes Kasa, Kurian, MD  predniSONE  (DELTASONE ) 20 MG tablet TAKE 1 TABLET (20 MG TOTAL) BY MOUTH DAILY WITH BREAKFAST. 10 DAYS Patient not taking: Reported on 04/01/2024 07/20/23   Kasa, Kurian, MD  predniSONE  (DELTASONE ) 20 MG tablet Take 1 tablet (20 mg total) by mouth daily with breakfast. 7 days Patient not taking: Reported on 04/01/2024 10/08/23   Isaiah Scrivener, MD    Allergies[1]  Patient Active Problem List   Diagnosis Date Noted   Exudative age-related macular degeneration, unspecified laterality, unspecified stage (HCC) 02/26/2024   Obesity, morbid (HCC) 02/26/2024   Prostate cancer (HCC) 02/13/2023   Chronic  obstructive pulmonary disease (HCC) 01/14/2022   Auditory complaints of both ears 01/14/2022   History of elevated PSA 07/11/2021   Prediabetes 07/11/2021   History of COPD 07/11/2021   Dyslipidemia 12/16/2006    Past Medical History:  Diagnosis Date   Age-related macular degeneration, dry, right eye    Age-related macular degeneration, wet, left eye (HCC)    10-09-2022  per pt eye injection's every 8 weeks   Allergy    COPD (chronic obstructive pulmonary disease) (HCC)    pulmnologist--- dr lois isaiah;  mild copd w/ emphysema;   does not have oxygen   Decreased vision of left eye    DOE (dyspnea on exertion)    Dyslipidemia    GERD (gastroesophageal reflux disease)    Hiatal hernia    History of ulcerative colitis 2010   (10-09-2022  per pt has not had issues in several years)  previously followed by  [facility] last seen 2017 (office note in care everywhere) -- left sided UC  in clinical remission ,  dx 2010 ,   History of vertebral compression fracture 2022   T8   HOH (hard of hearing)    left > right   Malignant neoplasm prostate (HCC) 07/2022   urologist--- dr matilda;  bx 04/ 2024, Gleason 4+3   Nocturia    OSA (obstructive sleep apnea) 2017   pulmonologist-- dr nickolas cellar;   stopped using cpap   PVC's (premature ventricular contractions)    Seasonal allergies    Sleep apnea     Past Surgical History:  Procedure Laterality Date   APPENDECTOMY     open   CATARACT EXTRACTION W/ INTRAOCULAR LENS IMPLANT Right 2019   CHOLECYSTECTOMY OPEN  1985   COLONOSCOPY  2015   KNEE SURGERY Left 1952   per pt tumor removed, benign   ORCHIECTOMY Bilateral 10/17/2022   Procedure: SIMPLE ORCHIECTOMY;  Surgeon: Matilda Senior, MD;  Location: [facility];  Service: Urology;  Laterality: Bilateral;  30 MINS   TONSILLECTOMY     child    Social History   Socioeconomic History   Marital status: Widowed    Spouse name: Not on file   Number of children: Not on file    Years of education: Not on file   Highest education level: Some college, no degree  Occupational History   Not on file  Tobacco Use   Smoking status: Former    Current packs/day: 0.00    Types: Cigarettes    Start date: 35    Quit date: 52    Years since quitting: 71.9    Passive exposure: Past (extensive second hand smoke by parents for 13yrs, and 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 12/11/23  Vaping Use   Vaping status: Never Used  Substance and Sexual Activity   Alcohol use: Not Currently   Drug use: Never   Sexual activity: Not Currently  Other Topics Concern   Not on file  Social History Narrative   Widowed   Social Drivers of Health   Tobacco Use: Medium Risk (04/01/2024)   Patient History    Smoking Tobacco Use: Former    Smokeless Tobacco Use: Never    Passive Exposure: Past  Physicist, Medical Strain: Low Risk (02/22/2024)   Overall Financial Resource Strain (CARDIA)    Difficulty of Paying Living Expenses: Not hard at all  Food Insecurity: No Food Insecurity (02/22/2024)   Epic    Worried About Programme Researcher, Broadcasting/film/video in the Last Year: Never true    Ran Out of Food in the Last Year: Never true  Transportation Needs: No Transportation Needs (02/22/2024)   Epic    Lack of Transportation (Medical): No    Lack of Transportation (Non-Medical): No  Physical Activity: Unknown (02/22/2024)   Exercise Vital Sign    Days of Exercise per Week: Patient declined    Minutes of Exercise per Session: Not on file  Stress: No Stress Concern Present (02/22/2024)   [employer] of Occupational Health - Occupational Stress Questionnaire    Feeling of Stress: Only a little  Social Connections: Socially Isolated (02/22/2024)   Social Connection and Isolation Panel    Frequency of  Communication with Friends and Family: More than three times a week    Frequency of Social Gatherings with Friends and Family: Once a week    Attends Religious Services: Never    Database Administrator or Organizations: No    Attends Engineer, Structural: Not on file    Marital Status: Widowed  Intimate Partner Violence: Not At Risk (10/22/2023)   Epic    Fear of Current or Ex-Partner: No    Emotionally Abused: No    Physically Abused: No    Sexually Abused: No  Depression (PHQ2-9): Low Risk (04/01/2024)   Depression (PHQ2-9)    PHQ-2 Score: 0  Alcohol Screen: Low Risk (10/22/2023)   Alcohol Screen    Last Alcohol Screening Score (AUDIT): 0  Housing: Low Risk (02/22/2024)   Epic    Unable to Pay for Housing in the Last Year: No    Number of Times Moved in the Last Year: 0    Homeless in the Last Year: No  Utilities: Not At Risk (10/22/2023)   Epic    Threatened with loss of utilities: No  Health Literacy: Inadequate Health Literacy (10/22/2023)   B1300 Health Literacy    Frequency of need for help with medical instructions: Often    History reviewed. No pertinent family history.   Review of Systems  Constitutional:  Negative for chills and fever.  HENT: Negative.  Negative for congestion and sore throat.   Respiratory: Negative.  Negative for cough and shortness of breath.   Cardiovascular: Negative.  Negative for chest pain and palpitations.  Gastrointestinal:  Negative for abdominal pain, diarrhea, nausea and vomiting.  Genitourinary: Negative.  Negative for dysuria and hematuria.  Musculoskeletal:  Positive for back pain.  Skin: Negative.  Negative for rash.  Neurological:  Positive for dizziness.  All other systems reviewed and are negative.   Vitals:   04/01/24 1039  BP: 122/80  Pulse: 93  Temp: 97.7 F (36.5 C)  SpO2: 98%    Physical Exam Vitals reviewed.  Constitutional:      Appearance: Normal appearance.  HENT:     Head: Normocephalic.      Mouth/Throat:     Mouth: Mucous membranes are moist.     Pharynx: Oropharynx is clear.  Eyes:     Extraocular Movements: Extraocular movements intact.     Conjunctiva/sclera: Conjunctivae normal.     Pupils: Pupils are equal, round, and reactive to light.  Cardiovascular:     Rate and Rhythm: Normal rate and regular rhythm.     Pulses: Normal pulses.     Heart sounds: Normal heart sounds.  Pulmonary:     Effort: Pulmonary effort is normal.     Breath sounds: Normal breath sounds.  Musculoskeletal:     Cervical back: No tenderness.  Lymphadenopathy:     Cervical: No cervical adenopathy.  Skin:    General: Skin is warm and dry.     Capillary Refill: Capillary refill takes less than 2 seconds.  Neurological:     General: No focal deficit present.     Mental Status: He is alert and oriented to person, place, and time.  Psychiatric:        Mood and Affect: Mood normal.        Behavior: Behavior normal.      ASSESSMENT & PLAN: A total of 43 minutes was spent with the patient and counseling/coordination of care regarding preparing for this visit, review of most recent office visit notes, review  of most recent emergency department visit notes, review of most recent imaging reports, pain management, treatment of chronic vertigo, review of multiple chronic medical conditions and their management, review of all medications, review of most recent bloodwork results, review of health maintenance items, education on nutrition, prognosis, documentation, and need for follow up.   Problem List Items Addressed This Visit       Respiratory   Chronic obstructive pulmonary disease (HCC)   Clinically stable.  No concerns Evaluation by pulmonary last August as follows: Assessment of COPD Assessment of bronchiectasis Only use albuterol  as needed For mucus clearance and pulmonary toilet-plan for following regimen DuoNeb nebulizer in AM-use Incentive spirometry 5 times, then use flutter valve 5  times DuoNeb nebulizer in the afternoon-use incentive spirometry 5 times, use flutter valve 5 times DuoNeb nebulizer at [employer] spirometry 5 times use flutter valve 5 times        Musculoskeletal and Integument   Compression fracture of L1 lumbar vertebra (HCC)   Healing well Pain well-controlled Stable without complications Follows up with back surgeon        Other   Dyslipidemia   Chronic stable condition Intolerant to statins. Diet and nutrition discussed      Prediabetes   Clinically stable. Diet and nutrition discussed.       Chronic vertigo - Primary   Intermittent.  Episode this morning. Related to medications and chronic medical conditions Advised to avoid getting up too fast Clinically stable.  No neurodeficits. No significant falls.  Fall precautions given.      Obesity, morbid (HCC)   Wt Readings from Last 3 Encounters:  04/01/24 195 lb (88.5 kg)  02/26/24 207 lb (93.9 kg)  12/11/23 215 lb (97.5 kg)  Eating better and losing weight Diet and nutrition discussed       Patient Instructions  Health Maintenance After Age 77 After age 30, you are at a higher risk for certain long-term diseases and infections as well as injuries from falls. Falls are a major cause of broken bones and head injuries in people who are older than age 75. Getting regular preventive care can help to keep you healthy and well. Preventive care includes getting regular testing and making lifestyle changes as recommended by your health care provider. Talk with your health care provider about: Which screenings and tests you should have. A screening is a test that checks for a disease when you have no symptoms. A diet and exercise plan that is right for you. What should I know about screenings and tests to prevent falls? Screening and testing are the best ways to find a health problem early. Early diagnosis and treatment give you the best chance of managing medical conditions  that are common after age 19. Certain conditions and lifestyle choices may make you more likely to have a fall. Your health care provider may recommend: Regular vision checks. Poor vision and conditions such as cataracts can make you more likely to have a fall. If you wear glasses, make sure to get your prescription updated if your vision changes. Medicine review. Work with your health care provider to regularly review all of the medicines you are taking, including over-the-counter medicines. Ask your health care provider about any side effects that may make you more likely to have a fall. Tell your health care provider if any medicines that you take make you feel dizzy or sleepy. Strength and balance checks. Your health care provider may recommend certain tests to check your  strength and balance while standing, walking, or changing positions. Foot health exam. Foot pain and numbness, as well as not wearing proper footwear, can make you more likely to have a fall. Screenings, including: Osteoporosis screening. Osteoporosis is a condition that causes the bones to get weaker and break more easily. Blood pressure screening. Blood pressure changes and medicines to control blood pressure can make you feel dizzy. Depression screening. You may be more likely to have a fall if you have a fear of falling, feel depressed, or feel unable to do activities that you used to do. Alcohol use screening. Using too much alcohol can affect your balance and may make you more likely to have a fall. Follow these instructions at home: Lifestyle Do not drink alcohol if: Your health care provider tells you not to drink. If you drink alcohol: Limit how much you have to: 0-1 drink a day for women. 0-2 drinks a day for men. Know how much alcohol is in your drink. In the [place] one drink equals one 12 oz bottle of beer (355 mL), one 5 oz glass of wine (148 mL), or one 1 oz glass of hard liquor (44 mL). Do not use any products  that contain nicotine or tobacco. These products include cigarettes, chewing tobacco, and vaping devices, such as e-cigarettes. If you need help quitting, ask your health care provider. Activity  Follow a regular exercise program to stay fit. This will help you maintain your balance. Ask your health care provider what types of exercise are appropriate for you. If you need a cane or walker, use it as recommended by your health care provider. Wear supportive shoes that have nonskid soles. Safety  Remove any tripping hazards, such as rugs, cords, and clutter. Install safety equipment such as grab bars in bathrooms and safety rails on stairs. Keep rooms and walkways well-lit. General instructions Talk with your health care provider about your risks for falling. Tell your health care provider if: You fall. Be sure to tell your health care provider about all falls, even ones that seem minor. You feel dizzy, tiredness (fatigue), or off-balance. Take over-the-counter and prescription medicines only as told by your health care provider. These include supplements. Eat a healthy diet and maintain a healthy weight. A healthy diet includes low-fat dairy products, low-fat (lean) meats, and fiber from whole grains, beans, and lots of fruits and vegetables. Stay current with your vaccines. Schedule regular health, dental, and eye exams. Summary Having a healthy lifestyle and getting preventive care can help to protect your health and wellness after age 105. Screening and testing are the best way to find a health problem early and help you avoid having a fall. Early diagnosis and treatment give you the best chance for managing medical conditions that are more common for people who are older than age 51. Falls are a major cause of broken bones and head injuries in people who are older than age 37. Take precautions to prevent a fall at home. Work with your health care provider to learn what changes you can make  to improve your health and wellness and to prevent falls. This information is not intended to replace advice given to you by your health care provider. Make sure you discuss any questions you have with your health care provider. Document Revised: 08/28/2020 Document Reviewed: 08/28/2020 Elsevier Patient Education  2024 Elsevier Inc.    Emil Schaumann, MD [facility]    [1]  Allergies Allergen Reactions  Statins Other (See Comments)    myalgias   Augmentin  [Amoxicillin -Pot Clavulanate] Rash   Sulfa Antibiotics Rash

## 2024-04-01 NOTE — Assessment & Plan Note (Signed)
Chronic stable condition Intolerant to statins Diet and nutrition discussed

## 2024-04-01 NOTE — Assessment & Plan Note (Signed)
Clinically stable. Diet and nutrition discussed.

## 2024-04-01 NOTE — Assessment & Plan Note (Signed)
 Healing well Pain well-controlled Stable without complications Follows up with back surgeon

## 2024-04-01 NOTE — Patient Instructions (Signed)
 Health Maintenance After Age 88 After age 27, you are at a higher risk for certain long-term diseases and infections as well as injuries from falls. Falls are a major cause of broken bones and head injuries in people who are older than age 73. Getting regular preventive care can help to keep you healthy and well. Preventive care includes getting regular testing and making lifestyle changes as recommended by your health care provider. Talk with your health care provider about: Which screenings and tests you should have. A screening is a test that checks for a disease when you have no symptoms. A diet and exercise plan that is right for you. What should I know about screenings and tests to prevent falls? Screening and testing are the best ways to find a health problem early. Early diagnosis and treatment give you the best chance of managing medical conditions that are common after age 90. Certain conditions and lifestyle choices may make you more likely to have a fall. Your health care provider may recommend: Regular vision checks. Poor vision and conditions such as cataracts can make you more likely to have a fall. If you wear glasses, make sure to get your prescription updated if your vision changes. Medicine review. Work with your health care provider to regularly review all of the medicines you are taking, including over-the-counter medicines. Ask your health care provider about any side effects that may make you more likely to have a fall. Tell your health care provider if any medicines that you take make you feel dizzy or sleepy. Strength and balance checks. Your health care provider may recommend certain tests to check your strength and balance while standing, walking, or changing positions. Foot health exam. Foot pain and numbness, as well as not wearing proper footwear, can make you more likely to have a fall. Screenings, including: Osteoporosis screening. Osteoporosis is a condition that causes  the bones to get weaker and break more easily. Blood pressure screening. Blood pressure changes and medicines to control blood pressure can make you feel dizzy. Depression screening. You may be more likely to have a fall if you have a fear of falling, feel depressed, or feel unable to do activities that you used to do. Alcohol  use screening. Using too much alcohol  can affect your balance and may make you more likely to have a fall. Follow these instructions at home: Lifestyle Do not drink alcohol  if: Your health care provider tells you not to drink. If you drink alcohol : Limit how much you have to: 0-1 drink a day for women. 0-2 drinks a day for men. Know how much alcohol  is in your drink. In the [place] one drink equals one 12 oz bottle of beer (355 mL), one 5 oz glass of wine (148 mL), or one 1 oz glass of hard liquor (44 mL). Do not use any products that contain nicotine or tobacco. These products include cigarettes, chewing tobacco, and vaping devices, such as e-cigarettes. If you need help quitting, ask your health care provider. Activity  Follow a regular exercise program to stay fit. This will help you maintain your balance. Ask your health care provider what types of exercise are appropriate for you. If you need a cane or walker, use it as recommended by your health care provider. Wear supportive shoes that have nonskid soles. Safety  Remove any tripping hazards, such as rugs, cords, and clutter. Install safety equipment such as grab bars in bathrooms and safety rails on stairs. Keep rooms and walkways  well-lit. General instructions Talk with your health care provider about your risks for falling. Tell your health care provider if: You fall. Be sure to tell your health care provider about all falls, even ones that seem minor. You feel dizzy, tiredness (fatigue), or off-balance. Take over-the-counter and prescription medicines only as told by your health care provider. These include  supplements. Eat a healthy diet and maintain a healthy weight. A healthy diet includes low-fat dairy products, low-fat (lean) meats, and fiber from whole grains, beans, and lots of fruits and vegetables. Stay current with your vaccines. Schedule regular health, dental, and eye exams. Summary Having a healthy lifestyle and getting preventive care can help to protect your health and wellness after age 15. Screening and testing are the best way to find a health problem early and help you avoid having a fall. Early diagnosis and treatment give you the best chance for managing medical conditions that are more common for people who are older than age 42. Falls are a major cause of broken bones and head injuries in people who are older than age 64. Take precautions to prevent a fall at home. Work with your health care provider to learn what changes you can make to improve your health and wellness and to prevent falls. This information is not intended to replace advice given to you by your health care provider. Make sure you discuss any questions you have with your health care provider. Document Revised: 08/28/2020 Document Reviewed: 08/28/2020 Elsevier Patient Education  2024 [employer].

## 2024-04-01 NOTE — Assessment & Plan Note (Signed)
 Wt Readings from Last 3 Encounters:  04/01/24 195 lb (88.5 kg)  02/26/24 207 lb (93.9 kg)  12/11/23 215 lb (97.5 kg)  Eating better and losing weight Diet and nutrition discussed

## 2024-04-01 NOTE — Assessment & Plan Note (Signed)
Intermittent.  Episode this morning. Related to medications and chronic medical conditions Advised to avoid getting up too fast Clinically stable.  No neurodeficits. No significant falls.  Fall precautions given.

## 2024-04-01 NOTE — Assessment & Plan Note (Signed)
 Clinically stable.  No concerns Evaluation by pulmonary last August as follows: Assessment of COPD Assessment of bronchiectasis Only use albuterol  as needed For mucus clearance and pulmonary toilet-plan for following regimen DuoNeb nebulizer in AM-use Incentive spirometry 5 times, then use flutter valve 5 times DuoNeb nebulizer in the afternoon-use incentive spirometry 5 times, use flutter valve 5 times DuoNeb nebulizer at [employer] spirometry 5 times use flutter valve 5 times

## 2024-04-03 ENCOUNTER — Encounter: Payer: Self-pay | Admitting: Emergency Medicine

## 2024-04-05 NOTE — Telephone Encounter (Signed)
Keep doing what is working.

## 2024-04-05 NOTE — Telephone Encounter (Signed)
 Please advise

## 2024-04-06 ENCOUNTER — Encounter: Payer: Self-pay | Admitting: Internal Medicine

## 2024-04-06 NOTE — Telephone Encounter (Signed)
 Cortisol is made in the adrenal glands not the testicles

## 2024-04-07 MED ORDER — ROFLUMILAST 250 MCG PO TABS: 250.0000 ug | ORAL_TABLET | Freq: Once | ORAL | 3 refills | Status: AC

## 2024-05-03 ENCOUNTER — Encounter: Payer: Self-pay | Admitting: Internal Medicine

## 2024-05-05 ENCOUNTER — Other Ambulatory Visit: Payer: Self-pay | Admitting: Internal Medicine

## 2024-05-05 DIAGNOSIS — J449 Chronic obstructive pulmonary disease, unspecified: Secondary | ICD-10-CM

## 2024-05-24 ENCOUNTER — Ambulatory Visit: Admitting: Internal Medicine

## 2024-08-04 ENCOUNTER — Ambulatory Visit: Admitting: Internal Medicine

## 2024-08-25 ENCOUNTER — Ambulatory Visit: Admitting: Emergency Medicine

## 2024-10-25 ENCOUNTER — Ambulatory Visit
